# Patient Record
Sex: Female | Born: 1945 | Race: White | Hispanic: No | Marital: Married | Smoking: Never smoker
Health system: Southern US, Community
[De-identification: ages and names within clinical notes are randomized; demographics above are authoritative.]

## PROBLEM LIST (undated history)

## (undated) DIAGNOSIS — I10 Essential (primary) hypertension: Secondary | ICD-10-CM

## (undated) DIAGNOSIS — M1990 Unspecified osteoarthritis, unspecified site: Secondary | ICD-10-CM

## (undated) DIAGNOSIS — G8929 Other chronic pain: Secondary | ICD-10-CM

## (undated) DIAGNOSIS — E785 Hyperlipidemia, unspecified: Secondary | ICD-10-CM

## (undated) HISTORY — PX: EYE SURGERY: SHX253

## (undated) HISTORY — PX: OTHER SURGICAL HISTORY: SHX169

## (undated) HISTORY — PX: BUNIONECTOMY: SHX129

## (undated) HISTORY — PX: FINGER MASS EXCISION: SHX1638

## (undated) HISTORY — PX: CHOLESTEATOMA EXCISION: SHX1345

## (undated) HISTORY — PX: CHOLECYSTECTOMY: SHX55

## (undated) HISTORY — PX: DILATION AND CURETTAGE OF UTERUS: SHX78

---

## 1997-09-03 ENCOUNTER — Other Ambulatory Visit: Admission: RE | Admit: 1997-09-03 | Discharge: 1997-09-03 | Payer: Self-pay | Admitting: Obstetrics and Gynecology

## 1999-01-20 ENCOUNTER — Encounter: Payer: Self-pay | Admitting: Obstetrics and Gynecology

## 1999-01-20 ENCOUNTER — Encounter: Admission: RE | Admit: 1999-01-20 | Discharge: 1999-01-20 | Payer: Self-pay | Admitting: Obstetrics and Gynecology

## 1999-02-03 ENCOUNTER — Encounter: Payer: Self-pay | Admitting: Obstetrics and Gynecology

## 1999-02-03 ENCOUNTER — Encounter: Admission: RE | Admit: 1999-02-03 | Discharge: 1999-02-03 | Payer: Self-pay | Admitting: Obstetrics and Gynecology

## 1999-02-15 ENCOUNTER — Other Ambulatory Visit: Admission: RE | Admit: 1999-02-15 | Discharge: 1999-02-15 | Payer: Self-pay | Admitting: Obstetrics and Gynecology

## 1999-07-12 ENCOUNTER — Other Ambulatory Visit: Admission: RE | Admit: 1999-07-12 | Discharge: 1999-07-12 | Payer: Self-pay | Admitting: *Deleted

## 1999-07-12 ENCOUNTER — Encounter (INDEPENDENT_AMBULATORY_CARE_PROVIDER_SITE_OTHER): Payer: Self-pay | Admitting: Specialist

## 2001-02-28 ENCOUNTER — Encounter: Admission: RE | Admit: 2001-02-28 | Discharge: 2001-02-28 | Payer: Self-pay | Admitting: Obstetrics and Gynecology

## 2001-02-28 ENCOUNTER — Encounter: Payer: Self-pay | Admitting: Obstetrics and Gynecology

## 2001-05-28 ENCOUNTER — Other Ambulatory Visit: Admission: RE | Admit: 2001-05-28 | Discharge: 2001-05-28 | Payer: Self-pay | Admitting: Obstetrics and Gynecology

## 2001-10-09 ENCOUNTER — Other Ambulatory Visit: Admission: RE | Admit: 2001-10-09 | Discharge: 2001-10-09 | Payer: Self-pay | Admitting: Obstetrics and Gynecology

## 2002-06-03 ENCOUNTER — Other Ambulatory Visit: Admission: RE | Admit: 2002-06-03 | Discharge: 2002-06-03 | Payer: Self-pay | Admitting: Obstetrics and Gynecology

## 2002-06-03 ENCOUNTER — Encounter: Admission: RE | Admit: 2002-06-03 | Discharge: 2002-06-03 | Payer: Self-pay | Admitting: Obstetrics and Gynecology

## 2002-06-03 ENCOUNTER — Encounter: Payer: Self-pay | Admitting: Obstetrics and Gynecology

## 2003-12-16 ENCOUNTER — Encounter: Admission: RE | Admit: 2003-12-16 | Discharge: 2003-12-16 | Payer: Self-pay | Admitting: Obstetrics and Gynecology

## 2004-12-16 ENCOUNTER — Encounter: Admission: RE | Admit: 2004-12-16 | Discharge: 2004-12-16 | Payer: Self-pay | Admitting: Obstetrics and Gynecology

## 2006-01-16 ENCOUNTER — Encounter: Admission: RE | Admit: 2006-01-16 | Discharge: 2006-01-16 | Payer: Self-pay | Admitting: Obstetrics and Gynecology

## 2007-02-06 ENCOUNTER — Encounter: Admission: RE | Admit: 2007-02-06 | Discharge: 2007-02-06 | Payer: Self-pay | Admitting: Obstetrics and Gynecology

## 2008-02-27 ENCOUNTER — Encounter: Admission: RE | Admit: 2008-02-27 | Discharge: 2008-02-27 | Payer: Self-pay | Admitting: Obstetrics and Gynecology

## 2010-02-20 ENCOUNTER — Encounter: Payer: Self-pay | Admitting: Obstetrics and Gynecology

## 2011-07-01 ENCOUNTER — Other Ambulatory Visit: Payer: Self-pay | Admitting: Family Medicine

## 2011-07-01 DIAGNOSIS — Z1239 Encounter for other screening for malignant neoplasm of breast: Secondary | ICD-10-CM

## 2011-07-05 ENCOUNTER — Ambulatory Visit
Admission: RE | Admit: 2011-07-05 | Discharge: 2011-07-05 | Disposition: A | Discharge status: Home / Self Care | Payer: Medicare Other | Source: Ambulatory Visit | Attending: Family Medicine | Admitting: Family Medicine

## 2011-07-05 DIAGNOSIS — Z1239 Encounter for other screening for malignant neoplasm of breast: Secondary | ICD-10-CM

## 2014-04-10 ENCOUNTER — Other Ambulatory Visit: Payer: Self-pay

## 2014-04-10 DIAGNOSIS — Z1231 Encounter for screening mammogram for malignant neoplasm of breast: Secondary | ICD-10-CM

## 2014-04-18 ENCOUNTER — Ambulatory Visit
Admission: RE | Admit: 2014-04-18 | Discharge: 2014-04-18 | Disposition: A | Discharge status: Home / Self Care | Payer: Medicare Other | Source: Ambulatory Visit

## 2014-04-18 DIAGNOSIS — Z1231 Encounter for screening mammogram for malignant neoplasm of breast: Secondary | ICD-10-CM

## 2015-08-25 ENCOUNTER — Encounter: Payer: Self-pay | Admitting: Family Medicine

## 2016-03-09 ENCOUNTER — Other Ambulatory Visit: Payer: Self-pay | Admitting: Family Medicine

## 2016-03-09 DIAGNOSIS — Z1231 Encounter for screening mammogram for malignant neoplasm of breast: Secondary | ICD-10-CM

## 2016-04-18 ENCOUNTER — Encounter: Payer: Self-pay | Admitting: Radiology

## 2016-04-18 ENCOUNTER — Ambulatory Visit
Admission: RE | Admit: 2016-04-18 | Discharge: 2016-04-18 | Disposition: A | Discharge status: Home / Self Care | Payer: Medicare Other | Source: Ambulatory Visit | Attending: Family Medicine | Admitting: Family Medicine

## 2016-04-18 DIAGNOSIS — Z1231 Encounter for screening mammogram for malignant neoplasm of breast: Secondary | ICD-10-CM

## 2016-04-20 ENCOUNTER — Other Ambulatory Visit: Payer: Self-pay | Admitting: Family Medicine

## 2016-04-20 DIAGNOSIS — R928 Other abnormal and inconclusive findings on diagnostic imaging of breast: Secondary | ICD-10-CM

## 2016-04-25 ENCOUNTER — Ambulatory Visit
Admission: RE | Admit: 2016-04-25 | Discharge: 2016-04-25 | Disposition: A | Discharge status: Home / Self Care | Payer: Medicare Other | Source: Ambulatory Visit | Attending: Family Medicine | Admitting: Family Medicine

## 2016-04-25 DIAGNOSIS — R928 Other abnormal and inconclusive findings on diagnostic imaging of breast: Secondary | ICD-10-CM

## 2016-12-07 DIAGNOSIS — Z01818 Encounter for other preprocedural examination: Secondary | ICD-10-CM | POA: Diagnosis not present

## 2017-03-24 ENCOUNTER — Other Ambulatory Visit: Payer: Self-pay | Admitting: Family Medicine

## 2017-03-24 DIAGNOSIS — Z139 Encounter for screening, unspecified: Secondary | ICD-10-CM

## 2017-03-29 ENCOUNTER — Encounter: Payer: Self-pay | Admitting: Sports Medicine

## 2017-03-29 ENCOUNTER — Ambulatory Visit (INDEPENDENT_AMBULATORY_CARE_PROVIDER_SITE_OTHER): Payer: Medicare Other

## 2017-03-29 ENCOUNTER — Ambulatory Visit: Payer: Medicare Other | Admitting: Sports Medicine

## 2017-03-29 VITALS — BP 116/59 | HR 62 | Ht 66.0 in | Wt 190.0 lb

## 2017-03-29 DIAGNOSIS — M792 Neuralgia and neuritis, unspecified: Secondary | ICD-10-CM

## 2017-03-29 DIAGNOSIS — M79674 Pain in right toe(s): Secondary | ICD-10-CM

## 2017-03-29 DIAGNOSIS — I868 Varicose veins of other specified sites: Secondary | ICD-10-CM | POA: Diagnosis not present

## 2017-03-29 DIAGNOSIS — M2041 Other hammer toe(s) (acquired), right foot: Secondary | ICD-10-CM | POA: Diagnosis not present

## 2017-03-29 NOTE — Progress Notes (Signed)
  Subjective: Laura Bauer is a 72 y.o. female patient who presents to office for evaluation of Right foot pain. Patient complains of progressive pain especially over the last several months especially in the right second toe and the forefoot with significant burning over the toe with the second toe being most bothersome.  Reports that pain is now interferring with daily activities. Patient has tried changing shoes with no relief in symptoms. Patient denies any other pedal complaints.   Review of Systems  Musculoskeletal: Positive for joint pain.  All other systems reviewed and are negative.   There are no active problems to display for this patient.   Current Outpatient Medications on File Prior to Visit  Medication Sig Dispense Refill  . aspirin EC 81 MG tablet Take by mouth.    . ezetimibe-simvastatin (VYTORIN) 10-20 MG tablet TAKE ONE TABLET BY MOUTH EVERY DAY    . ramipril (ALTACE) 5 MG capsule TAKE 1 CAPSULE BY MOUTH ONCE (1) DAILY  3  . Vitamin D, Ergocalciferol, (DRISDOL) 50000 units CAPS capsule TAKE 1 CAPSULE BY MOUTH two TIMES PER MONTH     No current facility-administered medications on file prior to visit.     No Known Allergies  Objective:  General: Alert and oriented x3 in no acute distress  Dermatology: Small hyperkeratotic lesion overlying 2nd PIPJ dorsally on the right. No open lesions bilateral lower extremities, no webspace macerations, no ecchymosis bilateral, all nails x 10 are well manicured.  Vascular: Dorsalis Pedis and Posterior Tibial pedal pulses 1/4, Capillary Fill Time 3 seconds,(+) pedal hair growth bilateral, trace edema bilateral lower extremities, Temperature gradient within normal limits.  Varicosities bilateral.  Neurology: Gross sensation intact via light touch bilateral, subjective burning to right foot.  Musculoskeletal: Semi-flexible hammertoes 2-5 with Mild tenderness with palpation at PIPJ Right second toe with significant medial deviation  and dorsal dislocation, previous bunion and hammertoe surgery on left. No pain with calf compression bilateral.  Strength within normal limits in all groups bilateral.   Gait: Unassisted, Non-antalgic.  Xrays  Right Foot    Impression: Lesser hammertoe deformity mineralization of bone within normal limits mildly elongated metatarsals no other acute findings.       Assessment and Plan: Problem List Items Addressed This Visit    None    Visit Diagnoses    Hammer toe of right foot    -  Primary   Relevant Orders   DG Foot Complete Right   Toe pain, right       Relevant Orders   DG Foot Complete Right   Neuritis       Spider varicose vein       Relevant Medications   aspirin EC 81 MG tablet   ramipril (ALTACE) 5 MG capsule   ezetimibe-simvastatin (VYTORIN) 10-20 MG tablet      -Complete examination performed -Xrays reviewed -Discussed treatement options for toe pain likely secondary to dislocated hammertoe however due to significant burning concern for neuritis and 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  Asencion Islamitorya Celene Pippins, DPM

## 2017-03-30 ENCOUNTER — Telehealth: Payer: Self-pay | Admitting: *Deleted

## 2017-03-30 DIAGNOSIS — M792 Neuralgia and neuritis, unspecified: Secondary | ICD-10-CM

## 2017-03-30 DIAGNOSIS — I868 Varicose veins of other specified sites: Secondary | ICD-10-CM

## 2017-03-30 NOTE — Telephone Encounter (Signed)
-----   Message from Hankinsonitorya Stover, [place] sent at 03/29/2017  9:50 AM EST ----- Regarding: Venous reflux testing and NCV Please order reflux testing to check varicose veins And NCV to check since has burning pain on right Thanks Dr. Marylene LandStover

## 2017-03-30 NOTE — Telephone Encounter (Signed)
Faxed orders for Venous Reflux to [facility]. Fax[facility] for NCV with [facility].

## 2017-04-19 ENCOUNTER — Ambulatory Visit
Admission: RE | Admit: 2017-04-19 | Discharge: 2017-04-19 | Disposition: A | Discharge status: Home / Self Care | Payer: Medicare Other | Source: Ambulatory Visit | Attending: Family Medicine | Admitting: Family Medicine

## 2017-04-19 DIAGNOSIS — Z139 Encounter for screening, unspecified: Secondary | ICD-10-CM

## 2018-03-13 ENCOUNTER — Other Ambulatory Visit: Payer: Self-pay | Admitting: Family Medicine

## 2018-03-13 DIAGNOSIS — Z1231 Encounter for screening mammogram for malignant neoplasm of breast: Secondary | ICD-10-CM

## 2018-04-24 ENCOUNTER — Ambulatory Visit: Payer: Medicare Other

## 2018-07-12 ENCOUNTER — Telehealth: Payer: Self-pay | Admitting: *Deleted

## 2018-07-12 NOTE — Telephone Encounter (Signed)
Dr Cannon Kettle please advise somehow didn't send to you

## 2018-07-12 NOTE — Telephone Encounter (Signed)
Patient states that she never received a call with her vascular results.  Please advise

## 2018-07-26 ENCOUNTER — Ambulatory Visit
Admission: RE | Admit: 2018-07-26 | Discharge: 2018-07-26 | Disposition: A | Discharge status: Home / Self Care | Payer: Medicare Other | Source: Ambulatory Visit | Attending: Family Medicine | Admitting: Family Medicine

## 2018-07-26 ENCOUNTER — Other Ambulatory Visit: Payer: Self-pay

## 2018-07-26 DIAGNOSIS — Z1231 Encounter for screening mammogram for malignant neoplasm of breast: Secondary | ICD-10-CM

## 2019-02-21 DIAGNOSIS — Z9889 Other specified postprocedural states: Secondary | ICD-10-CM | POA: Diagnosis not present

## 2019-02-21 DIAGNOSIS — Z48810 Encounter for surgical aftercare following surgery on the sense organs: Secondary | ICD-10-CM | POA: Diagnosis not present

## 2019-02-21 DIAGNOSIS — H906 Mixed conductive and sensorineural hearing loss, bilateral: Secondary | ICD-10-CM | POA: Diagnosis not present

## 2019-03-18 DIAGNOSIS — L259 Unspecified contact dermatitis, unspecified cause: Secondary | ICD-10-CM | POA: Diagnosis not present

## 2019-03-18 DIAGNOSIS — Z1331 Encounter for screening for depression: Secondary | ICD-10-CM | POA: Diagnosis not present

## 2019-03-18 DIAGNOSIS — Z6834 Body mass index (BMI) 34.0-34.9, adult: Secondary | ICD-10-CM | POA: Diagnosis not present

## 2019-03-20 DIAGNOSIS — Z0000 Encounter for general adult medical examination without abnormal findings: Secondary | ICD-10-CM | POA: Diagnosis not present

## 2019-03-28 DIAGNOSIS — Z780 Asymptomatic menopausal state: Secondary | ICD-10-CM | POA: Diagnosis not present

## 2019-04-05 DIAGNOSIS — K648 Other hemorrhoids: Secondary | ICD-10-CM | POA: Diagnosis not present

## 2019-04-05 DIAGNOSIS — K625 Hemorrhage of anus and rectum: Secondary | ICD-10-CM | POA: Diagnosis not present

## 2019-04-05 DIAGNOSIS — E669 Obesity, unspecified: Secondary | ICD-10-CM | POA: Diagnosis not present

## 2019-04-05 DIAGNOSIS — Z6833 Body mass index (BMI) 33.0-33.9, adult: Secondary | ICD-10-CM | POA: Diagnosis not present

## 2019-07-09 ENCOUNTER — Other Ambulatory Visit: Payer: Self-pay | Admitting: Family Medicine

## 2019-07-09 DIAGNOSIS — Z1231 Encounter for screening mammogram for malignant neoplasm of breast: Secondary | ICD-10-CM

## 2019-07-29 ENCOUNTER — Ambulatory Visit
Admission: RE | Admit: 2019-07-29 | Discharge: 2019-07-29 | Disposition: A | Discharge status: Home / Self Care | Payer: Medicare PPO | Source: Ambulatory Visit | Attending: Family Medicine | Admitting: Family Medicine

## 2019-07-29 ENCOUNTER — Other Ambulatory Visit: Payer: Self-pay

## 2019-07-29 DIAGNOSIS — Z1231 Encounter for screening mammogram for malignant neoplasm of breast: Secondary | ICD-10-CM | POA: Diagnosis not present

## 2019-07-31 ENCOUNTER — Other Ambulatory Visit: Payer: Self-pay | Admitting: Family Medicine

## 2019-07-31 DIAGNOSIS — R928 Other abnormal and inconclusive findings on diagnostic imaging of breast: Secondary | ICD-10-CM

## 2019-08-06 ENCOUNTER — Other Ambulatory Visit: Payer: Self-pay

## 2019-08-06 ENCOUNTER — Ambulatory Visit
Admission: RE | Admit: 2019-08-06 | Discharge: 2019-08-06 | Disposition: A | Discharge status: Home / Self Care | Payer: Medicare PPO | Source: Ambulatory Visit | Attending: Family Medicine | Admitting: Family Medicine

## 2019-08-06 DIAGNOSIS — N6002 Solitary cyst of left breast: Secondary | ICD-10-CM | POA: Diagnosis not present

## 2019-08-06 DIAGNOSIS — R922 Inconclusive mammogram: Secondary | ICD-10-CM | POA: Diagnosis not present

## 2019-08-06 DIAGNOSIS — R928 Other abnormal and inconclusive findings on diagnostic imaging of breast: Secondary | ICD-10-CM

## 2019-08-22 DIAGNOSIS — Z9889 Other specified postprocedural states: Secondary | ICD-10-CM | POA: Diagnosis not present

## 2019-08-22 DIAGNOSIS — H906 Mixed conductive and sensorineural hearing loss, bilateral: Secondary | ICD-10-CM | POA: Diagnosis not present

## 2019-08-22 DIAGNOSIS — H7292 Unspecified perforation of tympanic membrane, left ear: Secondary | ICD-10-CM | POA: Diagnosis not present

## 2019-08-22 DIAGNOSIS — H90A31 Mixed conductive and sensorineural hearing loss, unilateral, right ear with restricted hearing on the contralateral side: Secondary | ICD-10-CM | POA: Diagnosis not present

## 2019-08-22 DIAGNOSIS — H90A22 Sensorineural hearing loss, unilateral, left ear, with restricted hearing on the contralateral side: Secondary | ICD-10-CM | POA: Diagnosis not present

## 2019-08-22 DIAGNOSIS — Z48810 Encounter for surgical aftercare following surgery on the sense organs: Secondary | ICD-10-CM | POA: Diagnosis not present

## 2019-08-22 DIAGNOSIS — H918X9 Other specified hearing loss, unspecified ear: Secondary | ICD-10-CM | POA: Diagnosis not present

## 2019-08-26 DIAGNOSIS — E7800 Pure hypercholesterolemia, unspecified: Secondary | ICD-10-CM | POA: Diagnosis not present

## 2019-08-26 DIAGNOSIS — Z79899 Other long term (current) drug therapy: Secondary | ICD-10-CM | POA: Diagnosis not present

## 2019-09-02 DIAGNOSIS — M810 Age-related osteoporosis without current pathological fracture: Secondary | ICD-10-CM | POA: Diagnosis not present

## 2019-09-02 DIAGNOSIS — E7800 Pure hypercholesterolemia, unspecified: Secondary | ICD-10-CM | POA: Diagnosis not present

## 2019-09-02 DIAGNOSIS — Z6833 Body mass index (BMI) 33.0-33.9, adult: Secondary | ICD-10-CM | POA: Diagnosis not present

## 2019-09-02 DIAGNOSIS — Z9181 History of falling: Secondary | ICD-10-CM | POA: Diagnosis not present

## 2019-09-02 DIAGNOSIS — I10 Essential (primary) hypertension: Secondary | ICD-10-CM | POA: Diagnosis not present

## 2019-09-03 DIAGNOSIS — H26491 Other secondary cataract, right eye: Secondary | ICD-10-CM | POA: Diagnosis not present

## 2019-09-30 DIAGNOSIS — J329 Chronic sinusitis, unspecified: Secondary | ICD-10-CM | POA: Diagnosis not present

## 2019-10-08 DIAGNOSIS — H26492 Other secondary cataract, left eye: Secondary | ICD-10-CM | POA: Diagnosis not present

## 2019-11-14 DIAGNOSIS — Z23 Encounter for immunization: Secondary | ICD-10-CM | POA: Diagnosis not present

## 2020-02-13 DIAGNOSIS — Z6833 Body mass index (BMI) 33.0-33.9, adult: Secondary | ICD-10-CM | POA: Diagnosis not present

## 2020-02-13 DIAGNOSIS — H9202 Otalgia, left ear: Secondary | ICD-10-CM | POA: Diagnosis not present

## 2020-02-19 DIAGNOSIS — M542 Cervicalgia: Secondary | ICD-10-CM | POA: Diagnosis not present

## 2020-02-19 DIAGNOSIS — M25512 Pain in left shoulder: Secondary | ICD-10-CM | POA: Diagnosis not present

## 2020-02-20 DIAGNOSIS — H90A31 Mixed conductive and sensorineural hearing loss, unilateral, right ear with restricted hearing on the contralateral side: Secondary | ICD-10-CM | POA: Diagnosis not present

## 2020-02-20 DIAGNOSIS — H663X2 Other chronic suppurative otitis media, left ear: Secondary | ICD-10-CM | POA: Diagnosis not present

## 2020-02-20 DIAGNOSIS — Z9889 Other specified postprocedural states: Secondary | ICD-10-CM | POA: Diagnosis not present

## 2020-02-20 DIAGNOSIS — Z974 Presence of external hearing-aid: Secondary | ICD-10-CM | POA: Diagnosis not present

## 2020-02-20 DIAGNOSIS — H906 Mixed conductive and sensorineural hearing loss, bilateral: Secondary | ICD-10-CM | POA: Diagnosis not present

## 2020-02-20 DIAGNOSIS — H6983 Other specified disorders of Eustachian tube, bilateral: Secondary | ICD-10-CM | POA: Diagnosis not present

## 2020-02-20 DIAGNOSIS — H90A22 Sensorineural hearing loss, unilateral, left ear, with restricted hearing on the contralateral side: Secondary | ICD-10-CM | POA: Diagnosis not present

## 2020-02-20 DIAGNOSIS — Z9622 Myringotomy tube(s) status: Secondary | ICD-10-CM | POA: Diagnosis not present

## 2020-02-27 DIAGNOSIS — Z8669 Personal history of other diseases of the nervous system and sense organs: Secondary | ICD-10-CM | POA: Diagnosis not present

## 2020-02-27 DIAGNOSIS — H90A31 Mixed conductive and sensorineural hearing loss, unilateral, right ear with restricted hearing on the contralateral side: Secondary | ICD-10-CM | POA: Diagnosis not present

## 2020-02-27 DIAGNOSIS — Z9622 Myringotomy tube(s) status: Secondary | ICD-10-CM | POA: Diagnosis not present

## 2020-02-27 DIAGNOSIS — H6612 Chronic tubotympanic suppurative otitis media, left ear: Secondary | ICD-10-CM | POA: Diagnosis not present

## 2020-02-27 DIAGNOSIS — H6983 Other specified disorders of Eustachian tube, bilateral: Secondary | ICD-10-CM | POA: Diagnosis not present

## 2020-02-27 DIAGNOSIS — Z9889 Other specified postprocedural states: Secondary | ICD-10-CM | POA: Diagnosis not present

## 2020-02-27 DIAGNOSIS — Z974 Presence of external hearing-aid: Secondary | ICD-10-CM | POA: Diagnosis not present

## 2020-02-27 DIAGNOSIS — H906 Mixed conductive and sensorineural hearing loss, bilateral: Secondary | ICD-10-CM | POA: Diagnosis not present

## 2020-03-02 DIAGNOSIS — I10 Essential (primary) hypertension: Secondary | ICD-10-CM | POA: Diagnosis not present

## 2020-03-02 DIAGNOSIS — M8580 Other specified disorders of bone density and structure, unspecified site: Secondary | ICD-10-CM | POA: Diagnosis not present

## 2020-03-09 DIAGNOSIS — Z6833 Body mass index (BMI) 33.0-33.9, adult: Secondary | ICD-10-CM | POA: Diagnosis not present

## 2020-03-09 DIAGNOSIS — E7800 Pure hypercholesterolemia, unspecified: Secondary | ICD-10-CM | POA: Diagnosis not present

## 2020-03-09 DIAGNOSIS — I10 Essential (primary) hypertension: Secondary | ICD-10-CM | POA: Diagnosis not present

## 2020-03-09 DIAGNOSIS — M8580 Other specified disorders of bone density and structure, unspecified site: Secondary | ICD-10-CM | POA: Diagnosis not present

## 2020-03-12 DIAGNOSIS — M25512 Pain in left shoulder: Secondary | ICD-10-CM | POA: Diagnosis not present

## 2020-03-19 DIAGNOSIS — M25512 Pain in left shoulder: Secondary | ICD-10-CM | POA: Diagnosis not present

## 2020-03-23 DIAGNOSIS — M6281 Muscle weakness (generalized): Secondary | ICD-10-CM | POA: Diagnosis not present

## 2020-03-26 DIAGNOSIS — M6281 Muscle weakness (generalized): Secondary | ICD-10-CM | POA: Diagnosis not present

## 2020-03-31 DIAGNOSIS — M6281 Muscle weakness (generalized): Secondary | ICD-10-CM | POA: Diagnosis not present

## 2020-04-02 DIAGNOSIS — M6281 Muscle weakness (generalized): Secondary | ICD-10-CM | POA: Diagnosis not present

## 2020-04-06 DIAGNOSIS — M6281 Muscle weakness (generalized): Secondary | ICD-10-CM | POA: Diagnosis not present

## 2020-04-09 DIAGNOSIS — M6281 Muscle weakness (generalized): Secondary | ICD-10-CM | POA: Diagnosis not present

## 2020-04-13 DIAGNOSIS — M6281 Muscle weakness (generalized): Secondary | ICD-10-CM | POA: Diagnosis not present

## 2020-04-16 DIAGNOSIS — M6281 Muscle weakness (generalized): Secondary | ICD-10-CM | POA: Diagnosis not present

## 2020-04-20 DIAGNOSIS — M6281 Muscle weakness (generalized): Secondary | ICD-10-CM | POA: Diagnosis not present

## 2020-04-23 DIAGNOSIS — M6281 Muscle weakness (generalized): Secondary | ICD-10-CM | POA: Diagnosis not present

## 2020-04-27 DIAGNOSIS — M6281 Muscle weakness (generalized): Secondary | ICD-10-CM | POA: Diagnosis not present

## 2020-04-30 DIAGNOSIS — M6281 Muscle weakness (generalized): Secondary | ICD-10-CM | POA: Diagnosis not present

## 2020-05-11 DIAGNOSIS — J302 Other seasonal allergic rhinitis: Secondary | ICD-10-CM | POA: Diagnosis not present

## 2020-05-20 DIAGNOSIS — K648 Other hemorrhoids: Secondary | ICD-10-CM | POA: Diagnosis not present

## 2020-05-20 DIAGNOSIS — K5900 Constipation, unspecified: Secondary | ICD-10-CM | POA: Diagnosis not present

## 2020-06-01 DIAGNOSIS — I10 Essential (primary) hypertension: Secondary | ICD-10-CM | POA: Diagnosis not present

## 2020-06-01 DIAGNOSIS — Z6833 Body mass index (BMI) 33.0-33.9, adult: Secondary | ICD-10-CM | POA: Diagnosis not present

## 2020-06-01 DIAGNOSIS — Z1331 Encounter for screening for depression: Secondary | ICD-10-CM | POA: Diagnosis not present

## 2020-06-18 DIAGNOSIS — Z7982 Long term (current) use of aspirin: Secondary | ICD-10-CM | POA: Diagnosis not present

## 2020-06-18 DIAGNOSIS — I10 Essential (primary) hypertension: Secondary | ICD-10-CM | POA: Diagnosis not present

## 2020-06-18 DIAGNOSIS — Z86010 Personal history of colonic polyps: Secondary | ICD-10-CM | POA: Diagnosis not present

## 2020-06-18 DIAGNOSIS — Z79899 Other long term (current) drug therapy: Secondary | ICD-10-CM | POA: Diagnosis not present

## 2020-06-18 DIAGNOSIS — K648 Other hemorrhoids: Secondary | ICD-10-CM | POA: Diagnosis not present

## 2020-06-18 DIAGNOSIS — Z09 Encounter for follow-up examination after completed treatment for conditions other than malignant neoplasm: Secondary | ICD-10-CM | POA: Diagnosis not present

## 2020-07-21 ENCOUNTER — Other Ambulatory Visit: Payer: Self-pay | Admitting: Family Medicine

## 2020-07-21 DIAGNOSIS — Z1231 Encounter for screening mammogram for malignant neoplasm of breast: Secondary | ICD-10-CM

## 2020-08-17 DIAGNOSIS — E7800 Pure hypercholesterolemia, unspecified: Secondary | ICD-10-CM | POA: Diagnosis not present

## 2020-08-21 DIAGNOSIS — I10 Essential (primary) hypertension: Secondary | ICD-10-CM | POA: Diagnosis not present

## 2020-08-21 DIAGNOSIS — Z6833 Body mass index (BMI) 33.0-33.9, adult: Secondary | ICD-10-CM | POA: Diagnosis not present

## 2020-08-21 DIAGNOSIS — E7800 Pure hypercholesterolemia, unspecified: Secondary | ICD-10-CM | POA: Diagnosis not present

## 2020-08-21 DIAGNOSIS — Z0000 Encounter for general adult medical examination without abnormal findings: Secondary | ICD-10-CM | POA: Diagnosis not present

## 2020-08-21 DIAGNOSIS — Z1331 Encounter for screening for depression: Secondary | ICD-10-CM | POA: Diagnosis not present

## 2020-08-27 DIAGNOSIS — H6042 Cholesteatoma of left external ear: Secondary | ICD-10-CM | POA: Diagnosis not present

## 2020-08-27 DIAGNOSIS — H906 Mixed conductive and sensorineural hearing loss, bilateral: Secondary | ICD-10-CM | POA: Diagnosis not present

## 2020-08-27 DIAGNOSIS — H7202 Central perforation of tympanic membrane, left ear: Secondary | ICD-10-CM | POA: Diagnosis not present

## 2020-08-27 DIAGNOSIS — H6983 Other specified disorders of Eustachian tube, bilateral: Secondary | ICD-10-CM | POA: Diagnosis not present

## 2020-08-27 DIAGNOSIS — Z9889 Other specified postprocedural states: Secondary | ICD-10-CM | POA: Diagnosis not present

## 2020-08-27 DIAGNOSIS — H900 Conductive hearing loss, bilateral: Secondary | ICD-10-CM | POA: Diagnosis not present

## 2020-09-08 DIAGNOSIS — H43813 Vitreous degeneration, bilateral: Secondary | ICD-10-CM | POA: Diagnosis not present

## 2020-09-08 DIAGNOSIS — Z961 Presence of intraocular lens: Secondary | ICD-10-CM | POA: Diagnosis not present

## 2020-09-08 DIAGNOSIS — H5203 Hypermetropia, bilateral: Secondary | ICD-10-CM | POA: Diagnosis not present

## 2020-09-11 ENCOUNTER — Other Ambulatory Visit: Payer: Self-pay

## 2020-09-11 ENCOUNTER — Ambulatory Visit
Admission: RE | Admit: 2020-09-11 | Discharge: 2020-09-11 | Disposition: A | Discharge status: Home / Self Care | Payer: Medicare PPO | Source: Ambulatory Visit | Attending: Family Medicine | Admitting: Family Medicine

## 2020-09-11 DIAGNOSIS — Z1231 Encounter for screening mammogram for malignant neoplasm of breast: Secondary | ICD-10-CM | POA: Diagnosis not present

## 2020-10-01 DIAGNOSIS — E785 Hyperlipidemia, unspecified: Secondary | ICD-10-CM | POA: Diagnosis not present

## 2020-10-01 DIAGNOSIS — Z803 Family history of malignant neoplasm of breast: Secondary | ICD-10-CM | POA: Diagnosis not present

## 2020-10-01 DIAGNOSIS — Z823 Family history of stroke: Secondary | ICD-10-CM | POA: Diagnosis not present

## 2020-10-01 DIAGNOSIS — Z6832 Body mass index (BMI) 32.0-32.9, adult: Secondary | ICD-10-CM | POA: Diagnosis not present

## 2020-10-01 DIAGNOSIS — E669 Obesity, unspecified: Secondary | ICD-10-CM | POA: Diagnosis not present

## 2020-10-01 DIAGNOSIS — I10 Essential (primary) hypertension: Secondary | ICD-10-CM | POA: Diagnosis not present

## 2020-10-01 DIAGNOSIS — I739 Peripheral vascular disease, unspecified: Secondary | ICD-10-CM | POA: Diagnosis not present

## 2020-10-01 DIAGNOSIS — Z7982 Long term (current) use of aspirin: Secondary | ICD-10-CM | POA: Diagnosis not present

## 2020-10-01 DIAGNOSIS — E559 Vitamin D deficiency, unspecified: Secondary | ICD-10-CM | POA: Diagnosis not present

## 2020-10-27 DIAGNOSIS — Z23 Encounter for immunization: Secondary | ICD-10-CM | POA: Diagnosis not present

## 2020-11-17 DIAGNOSIS — E7800 Pure hypercholesterolemia, unspecified: Secondary | ICD-10-CM | POA: Diagnosis not present

## 2020-11-24 DIAGNOSIS — Z6833 Body mass index (BMI) 33.0-33.9, adult: Secondary | ICD-10-CM | POA: Diagnosis not present

## 2020-11-24 DIAGNOSIS — E7800 Pure hypercholesterolemia, unspecified: Secondary | ICD-10-CM | POA: Diagnosis not present

## 2020-11-24 DIAGNOSIS — Z9181 History of falling: Secondary | ICD-10-CM | POA: Diagnosis not present

## 2020-11-27 DIAGNOSIS — Z043 Encounter for examination and observation following other accident: Secondary | ICD-10-CM | POA: Diagnosis not present

## 2020-11-27 DIAGNOSIS — M19011 Primary osteoarthritis, right shoulder: Secondary | ICD-10-CM | POA: Diagnosis not present

## 2020-11-27 DIAGNOSIS — M25511 Pain in right shoulder: Secondary | ICD-10-CM | POA: Diagnosis not present

## 2020-12-01 DIAGNOSIS — M25511 Pain in right shoulder: Secondary | ICD-10-CM | POA: Diagnosis not present

## 2020-12-01 DIAGNOSIS — Z6833 Body mass index (BMI) 33.0-33.9, adult: Secondary | ICD-10-CM | POA: Diagnosis not present

## 2020-12-14 DIAGNOSIS — M25511 Pain in right shoulder: Secondary | ICD-10-CM | POA: Diagnosis not present

## 2020-12-14 DIAGNOSIS — M542 Cervicalgia: Secondary | ICD-10-CM | POA: Diagnosis not present

## 2020-12-28 DIAGNOSIS — M25511 Pain in right shoulder: Secondary | ICD-10-CM | POA: Diagnosis not present

## 2020-12-29 DIAGNOSIS — M25511 Pain in right shoulder: Secondary | ICD-10-CM | POA: Diagnosis not present

## 2020-12-29 DIAGNOSIS — M6281 Muscle weakness (generalized): Secondary | ICD-10-CM | POA: Diagnosis not present

## 2021-01-01 DIAGNOSIS — M25511 Pain in right shoulder: Secondary | ICD-10-CM | POA: Diagnosis not present

## 2021-01-01 DIAGNOSIS — M6281 Muscle weakness (generalized): Secondary | ICD-10-CM | POA: Diagnosis not present

## 2021-01-05 DIAGNOSIS — M25511 Pain in right shoulder: Secondary | ICD-10-CM | POA: Diagnosis not present

## 2021-01-05 DIAGNOSIS — M6281 Muscle weakness (generalized): Secondary | ICD-10-CM | POA: Diagnosis not present

## 2021-01-07 DIAGNOSIS — M25511 Pain in right shoulder: Secondary | ICD-10-CM | POA: Diagnosis not present

## 2021-01-07 DIAGNOSIS — M6281 Muscle weakness (generalized): Secondary | ICD-10-CM | POA: Diagnosis not present

## 2021-01-11 DIAGNOSIS — M542 Cervicalgia: Secondary | ICD-10-CM | POA: Diagnosis not present

## 2021-01-11 DIAGNOSIS — M25511 Pain in right shoulder: Secondary | ICD-10-CM | POA: Diagnosis not present

## 2021-01-12 DIAGNOSIS — M6281 Muscle weakness (generalized): Secondary | ICD-10-CM | POA: Diagnosis not present

## 2021-01-12 DIAGNOSIS — M25511 Pain in right shoulder: Secondary | ICD-10-CM | POA: Diagnosis not present

## 2021-01-14 DIAGNOSIS — M25511 Pain in right shoulder: Secondary | ICD-10-CM | POA: Diagnosis not present

## 2021-01-14 DIAGNOSIS — M6281 Muscle weakness (generalized): Secondary | ICD-10-CM | POA: Diagnosis not present

## 2021-01-18 DIAGNOSIS — M6281 Muscle weakness (generalized): Secondary | ICD-10-CM | POA: Diagnosis not present

## 2021-01-18 DIAGNOSIS — M25511 Pain in right shoulder: Secondary | ICD-10-CM | POA: Diagnosis not present

## 2021-01-21 DIAGNOSIS — M6281 Muscle weakness (generalized): Secondary | ICD-10-CM | POA: Diagnosis not present

## 2021-01-21 DIAGNOSIS — M25511 Pain in right shoulder: Secondary | ICD-10-CM | POA: Diagnosis not present

## 2021-02-02 DIAGNOSIS — M6281 Muscle weakness (generalized): Secondary | ICD-10-CM | POA: Diagnosis not present

## 2021-02-02 DIAGNOSIS — M25511 Pain in right shoulder: Secondary | ICD-10-CM | POA: Diagnosis not present

## 2021-02-04 DIAGNOSIS — M6281 Muscle weakness (generalized): Secondary | ICD-10-CM | POA: Diagnosis not present

## 2021-02-04 DIAGNOSIS — M25511 Pain in right shoulder: Secondary | ICD-10-CM | POA: Diagnosis not present

## 2021-02-08 DIAGNOSIS — M2578 Osteophyte, vertebrae: Secondary | ICD-10-CM | POA: Diagnosis not present

## 2021-02-08 DIAGNOSIS — M542 Cervicalgia: Secondary | ICD-10-CM | POA: Diagnosis not present

## 2021-02-10 DIAGNOSIS — M25511 Pain in right shoulder: Secondary | ICD-10-CM | POA: Diagnosis not present

## 2021-02-11 DIAGNOSIS — M25511 Pain in right shoulder: Secondary | ICD-10-CM | POA: Diagnosis not present

## 2021-02-11 DIAGNOSIS — M6281 Muscle weakness (generalized): Secondary | ICD-10-CM | POA: Diagnosis not present

## 2021-02-15 DIAGNOSIS — M542 Cervicalgia: Secondary | ICD-10-CM | POA: Diagnosis not present

## 2021-02-15 DIAGNOSIS — M25511 Pain in right shoulder: Secondary | ICD-10-CM | POA: Diagnosis not present

## 2021-02-15 DIAGNOSIS — M79621 Pain in right upper arm: Secondary | ICD-10-CM | POA: Diagnosis not present

## 2021-02-16 DIAGNOSIS — M25511 Pain in right shoulder: Secondary | ICD-10-CM | POA: Diagnosis not present

## 2021-02-20 DIAGNOSIS — M542 Cervicalgia: Secondary | ICD-10-CM | POA: Diagnosis not present

## 2021-02-20 DIAGNOSIS — M5412 Radiculopathy, cervical region: Secondary | ICD-10-CM | POA: Diagnosis not present

## 2021-02-25 DIAGNOSIS — Z9889 Other specified postprocedural states: Secondary | ICD-10-CM | POA: Diagnosis not present

## 2021-02-25 DIAGNOSIS — H6042 Cholesteatoma of left external ear: Secondary | ICD-10-CM | POA: Diagnosis not present

## 2021-02-25 DIAGNOSIS — H906 Mixed conductive and sensorineural hearing loss, bilateral: Secondary | ICD-10-CM | POA: Diagnosis not present

## 2021-02-25 DIAGNOSIS — H6983 Other specified disorders of Eustachian tube, bilateral: Secondary | ICD-10-CM | POA: Diagnosis not present

## 2021-02-25 DIAGNOSIS — H903 Sensorineural hearing loss, bilateral: Secondary | ICD-10-CM | POA: Diagnosis not present

## 2021-02-25 DIAGNOSIS — H7202 Central perforation of tympanic membrane, left ear: Secondary | ICD-10-CM | POA: Diagnosis not present

## 2021-03-02 DIAGNOSIS — M5412 Radiculopathy, cervical region: Secondary | ICD-10-CM | POA: Diagnosis not present

## 2021-03-18 DIAGNOSIS — E7800 Pure hypercholesterolemia, unspecified: Secondary | ICD-10-CM | POA: Diagnosis not present

## 2021-03-18 DIAGNOSIS — Z79899 Other long term (current) drug therapy: Secondary | ICD-10-CM | POA: Diagnosis not present

## 2021-03-23 DIAGNOSIS — Z6834 Body mass index (BMI) 34.0-34.9, adult: Secondary | ICD-10-CM | POA: Diagnosis not present

## 2021-03-23 DIAGNOSIS — I10 Essential (primary) hypertension: Secondary | ICD-10-CM | POA: Diagnosis not present

## 2021-03-23 DIAGNOSIS — E7800 Pure hypercholesterolemia, unspecified: Secondary | ICD-10-CM | POA: Diagnosis not present

## 2021-03-25 DIAGNOSIS — M5412 Radiculopathy, cervical region: Secondary | ICD-10-CM | POA: Diagnosis not present

## 2021-03-25 DIAGNOSIS — M25511 Pain in right shoulder: Secondary | ICD-10-CM | POA: Diagnosis not present

## 2021-04-28 DIAGNOSIS — M5412 Radiculopathy, cervical region: Secondary | ICD-10-CM | POA: Diagnosis not present

## 2021-04-29 DIAGNOSIS — S29012A Strain of muscle and tendon of back wall of thorax, initial encounter: Secondary | ICD-10-CM | POA: Diagnosis not present

## 2021-04-29 DIAGNOSIS — Z6833 Body mass index (BMI) 33.0-33.9, adult: Secondary | ICD-10-CM | POA: Diagnosis not present

## 2021-05-16 DIAGNOSIS — M5432 Sciatica, left side: Secondary | ICD-10-CM | POA: Diagnosis not present

## 2021-05-23 DIAGNOSIS — M5432 Sciatica, left side: Secondary | ICD-10-CM | POA: Diagnosis not present

## 2021-05-24 DIAGNOSIS — M79621 Pain in right upper arm: Secondary | ICD-10-CM | POA: Diagnosis not present

## 2021-05-24 DIAGNOSIS — M5412 Radiculopathy, cervical region: Secondary | ICD-10-CM | POA: Diagnosis not present

## 2021-05-24 DIAGNOSIS — M25511 Pain in right shoulder: Secondary | ICD-10-CM | POA: Diagnosis not present

## 2021-05-31 DIAGNOSIS — M25511 Pain in right shoulder: Secondary | ICD-10-CM | POA: Diagnosis not present

## 2021-06-07 DIAGNOSIS — M75121 Complete rotator cuff tear or rupture of right shoulder, not specified as traumatic: Secondary | ICD-10-CM | POA: Diagnosis not present

## 2021-06-07 DIAGNOSIS — M25511 Pain in right shoulder: Secondary | ICD-10-CM | POA: Diagnosis not present

## 2021-06-11 DIAGNOSIS — M75121 Complete rotator cuff tear or rupture of right shoulder, not specified as traumatic: Secondary | ICD-10-CM | POA: Diagnosis not present

## 2021-06-19 DIAGNOSIS — R42 Dizziness and giddiness: Secondary | ICD-10-CM | POA: Diagnosis not present

## 2021-06-19 DIAGNOSIS — R55 Syncope and collapse: Secondary | ICD-10-CM | POA: Diagnosis not present

## 2021-07-02 DIAGNOSIS — M25511 Pain in right shoulder: Secondary | ICD-10-CM | POA: Diagnosis not present

## 2021-07-04 DIAGNOSIS — G8929 Other chronic pain: Secondary | ICD-10-CM | POA: Diagnosis not present

## 2021-07-04 DIAGNOSIS — M5441 Lumbago with sciatica, right side: Secondary | ICD-10-CM | POA: Diagnosis not present

## 2021-07-27 ENCOUNTER — Other Ambulatory Visit: Payer: Self-pay

## 2021-07-27 ENCOUNTER — Encounter (HOSPITAL_BASED_OUTPATIENT_CLINIC_OR_DEPARTMENT_OTHER): Payer: Self-pay | Admitting: Orthopaedic Surgery

## 2021-07-29 ENCOUNTER — Other Ambulatory Visit: Payer: Self-pay | Admitting: Family Medicine

## 2021-07-29 DIAGNOSIS — Z1231 Encounter for screening mammogram for malignant neoplasm of breast: Secondary | ICD-10-CM

## 2021-08-04 DIAGNOSIS — Z96611 Presence of right artificial shoulder joint: Secondary | ICD-10-CM | POA: Diagnosis not present

## 2021-08-04 DIAGNOSIS — M75121 Complete rotator cuff tear or rupture of right shoulder, not specified as traumatic: Secondary | ICD-10-CM | POA: Diagnosis not present

## 2021-08-09 ENCOUNTER — Encounter (HOSPITAL_BASED_OUTPATIENT_CLINIC_OR_DEPARTMENT_OTHER)
Admission: RE | Admit: 2021-08-09 | Discharge: 2021-08-09 | Disposition: A | Discharge status: Home / Self Care | Payer: Medicare PPO | Source: Ambulatory Visit | Attending: Orthopaedic Surgery | Admitting: Orthopaedic Surgery

## 2021-08-09 DIAGNOSIS — M19011 Primary osteoarthritis, right shoulder: Secondary | ICD-10-CM | POA: Diagnosis not present

## 2021-08-09 DIAGNOSIS — Z01810 Encounter for preprocedural cardiovascular examination: Secondary | ICD-10-CM | POA: Insufficient documentation

## 2021-08-09 DIAGNOSIS — M75121 Complete rotator cuff tear or rupture of right shoulder, not specified as traumatic: Secondary | ICD-10-CM | POA: Diagnosis not present

## 2021-08-09 DIAGNOSIS — E785 Hyperlipidemia, unspecified: Secondary | ICD-10-CM | POA: Diagnosis not present

## 2021-08-09 DIAGNOSIS — I10 Essential (primary) hypertension: Secondary | ICD-10-CM | POA: Insufficient documentation

## 2021-08-09 LAB — SURGICAL PCR SCREEN
MRSA, PCR: NEGATIVE
Staphylococcus aureus: NEGATIVE

## 2021-08-09 NOTE — Progress Notes (Signed)
Surgical soap given with instructions, pt verbalized understanding. Enhanced Recovery after Surgery  ?Enhanced Recovery after Surgery is a protocol used to improve the stress on your body and your recovery after surgery. ? ?Patient Instructions ? ?The night before surgery:  ?No food after midnight. ONLY clear liquids after midnight ? ?The day of surgery (if you do NOT have diabetes):  ?Drink ONE (1) Pre-Surgery Clear Ensure as directed.   ?This drink was given to you during your hospital  ?pre-op appointment visit. ?The pre-op nurse will instruct you on the time to drink the  ?Pre-Surgery Ensure depending on your surgery time. ?Finish the drink at the designated time by the pre-op nurse.  ?Nothing else to drink after completing the  ?Pre-Surgery Clear Ensure. ? ?The day of surgery (if you have diabetes): ?Drink ONE (1) Gatorade 2 (G2) as directed. ?This drink was given to you during your hospital  ?pre-op appointment visit.  ?The pre-op nurse will instruct you on the time to drink the  ? Gatorade 2 (G2) depending on your surgery time. ?Color of the Gatorade may vary. Red is not allowed. ?Nothing else to drink after completing the  ?Gatorade 2 (G2). ? ?       If office.you have questions, please contact your surgeon?s office  ?Benzoyl peroxide gel given with written instructions, pt verbalized understanding.  ?

## 2021-08-09 NOTE — Care Plan (Signed)
Ortho Bundle Case Management Note  Patient Details  Name: Laura Bauer MRN: [id number] Date of Birth: 10-04-1945     Spoke with patient prior to surgery. She will discharge to home with family to assist. Should get CTU from [facility]. No other DME. OPPT set up with [facility] [place]. Patient and MD in agreement with plan. Choice offered                 DME Arranged:    DME Agency:     HH Arranged:    HH Agency:     Additional Comments: Please contact me with any questions of if this plan should need to change.  Shauna Hugh,  RN,BSN,MHA,CCM  [facility]  [phone] 08/09/2021, 10:09 AM

## 2021-08-11 NOTE — Anesthesia Preprocedure Evaluation (Signed)
Anesthesia Evaluation  Patient identified by MRN, date of birth, ID band Patient awake    Reviewed: Allergy & Precautions, NPO status , Patient's Chart, lab work & pertinent test results  Airway Mallampati: II  TM Distance: >3 FB Neck ROM: Full    Dental no notable dental hx. (+) Teeth Intact, Dental Advisory Given   Pulmonary neg pulmonary ROS,    Pulmonary exam normal breath sounds clear to auscultation       Cardiovascular hypertension, Pt. on medications Normal cardiovascular exam Rhythm:Regular Rate:Normal     Neuro/Psych negative neurological ROS  negative psych ROS   GI/Hepatic negative GI ROS, Neg liver ROS,   Endo/Other  BMI 34  Renal/GU negative Renal ROS  negative genitourinary   Musculoskeletal  (+) Arthritis , Osteoarthritis,    Abdominal   Peds  Hematology negative hematology ROS (+)   Anesthesia Other Findings   Reproductive/Obstetrics negative OB ROS                            Anesthesia Physical Anesthesia Plan  ASA: 2  Anesthesia Plan: General and Regional   Post-op Pain Management: Regional block*   Induction: Intravenous  PONV Risk Score and Plan: 3 and Ondansetron, Dexamethasone and Treatment may vary due to age or medical condition  Airway Management Planned: Oral ETT  Additional Equipment: None  Intra-op Plan:   Post-operative Plan: Extubation in OR  Informed Consent: I have reviewed the patients History and Physical, chart, labs and discussed the procedure including the risks, benefits and alternatives for the proposed anesthesia with the patient or authorized representative who has indicated his/her understanding and acceptance.     Dental advisory given  Plan Discussed with: CRNA  Anesthesia Plan Comments:        Anesthesia Quick Evaluation

## 2021-08-11 NOTE — H&P (Cosign Needed Addendum)
PREOPERATIVE H&P  Chief Complaint: RIGHT SHOULDER DJD  HPI: Laura Bauer is a 76 y.o. female who is scheduled for Procedure(s): REVERSE SHOULDER ARTHROPLASTY.   Patient has a past medical history significant for HTN and HLD.   Patient a 76 year old who has had right shoulder pain for quite some time. She was seen by other providers who noted there was a rotator cuff tear and there was poor tissue quality noted. She is worried about a repair having a high likelihood of failure. She has done therapy and injections.   Symptoms are rated as moderate to severe, and have been worsening.  This is significantly impairing activities of daily living.    Please see clinic note for further details on this patient's care.    She has elected for surgical management.   Past Medical History:  Diagnosis Date   Arthritis    Chronic bilateral low back pain with bilateral sciatica    Hyperlipidemia    Hypertension    Past Surgical History:  Procedure Laterality Date   BUNIONECTOMY Bilateral    CHOLECYSTECTOMY     CHOLESTEATOMA EXCISION Left    DILATION AND CURETTAGE OF UTERUS     EYE SURGERY Bilateral    cataract   FINGER MASS EXCISION Right    5th finger   thumb arthroplasty Left    Social History   Socioeconomic History   Marital status: Married    Spouse name: Not on file   Number of children: Not on file   Years of education: Not on file   Highest education level: Not on file  Occupational History   Not on file  Tobacco Use   Smoking status: Never   Smokeless tobacco: Never  Substance and Sexual Activity   Alcohol use: Never   Drug use: Never   Sexual activity: Not Currently    Birth control/protection: Post-menopausal  Other Topics Concern   Not on file  Social History Narrative   Not on file   Social Determinants of Health   Financial Resource Strain: Not on file  Food Insecurity: Not on file  Transportation Needs: Not on file  Physical Activity: Not on  file  Stress: Not on file  Social Connections: Not on file   Family History  Problem Relation Age of Onset   Breast cancer Mother 85   No Known Allergies Prior to Admission medications   Medication Sig Start Date End Date Taking? Authorizing Provider  aspirin EC 81 MG tablet Take by mouth.   Yes [provider]  B Complex-C (SUPER B-COMPLEX + VITAMIN C PO) Take by mouth.   Yes [provider]  ezetimibe-simvastatin (VYTORIN) 10-20 MG tablet TAKE ONE TABLET BY MOUTH EVERY DAY 01/29/15  Yes [provider]  Multiple Vitamins-Minerals (CENTRUM SILVER 50+WOMEN PO) Take by mouth.   Yes [provider]  polyethylene glycol (MIRALAX / GLYCOLAX) 17 g packet Take 17 g by mouth daily.   Yes [provider]  saccharomyces boulardii (FLORASTOR) 250 MG capsule Take 250 mg by mouth 2 (two) times daily.   Yes [provider]  traMADol (ULTRAM) 50 MG tablet Take by mouth every 6 (six) hours as needed.   Yes [provider]  valsartan (DIOVAN) 320 MG tablet Take 320 mg by mouth daily.   Yes [provider]  Vitamin D, Ergocalciferol, (DRISDOL) 50000 units CAPS capsule TAKE 1 CAPSULE BY MOUTH two TIMES PER MONTH 12/12/16  Yes [provider]  ROS: All other systems have been reviewed and were otherwise negative with the exception of those mentioned in the HPI and as above.  Physical Exam: General: Alert, no acute distress Cardiovascular: No pedal edema Respiratory: No cyanosis, no use of accessory musculature GI: No organomegaly, abdomen is soft and non-tender Skin: No lesions in the area of chief complaint Neurologic: Sensation intact distally Psychiatric: Patient is competent for consent with normal mood and affect Lymphatic: No axillary or cervical lymphadenopathy  MUSCULOSKELETAL:  Examination the right shoulder range of motion is to 90 degrees. Passive 170. External rotation to 45, internal rotation to the  beltline. She is weak with supraspinatus and infraspinatus testing.  Imaging:  MRI is reviewed and this is imported into our system. 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 There is a significant amount of signal around the greater tuberosity consistent with chronic impingement.    BMI: Estimated body mass index is 33.89 kg/m as calculated from the following:   Height as of this encounter: 5\' 6"  (1.676 m).   Weight as of this encounter: 95.3 kg.  No results found for: "ALBUMIN" Diabetes: Patient does not have a diagnosis of diabetes.     Smoking Status:   reports that she has never smoked. She has never used smokeless tobacco.     Assessment: RIGHT SHOULDER DJD  Plan: Plan for Procedure(s): REVERSE SHOULDER ARTHROPLASTY   The risks benefits and alternatives were discussed with the patient including but not limited to the risks of nonoperative treatment, versus surgical intervention including infection, bleeding, nerve injury,  blood clots, cardiopulmonary complications, morbidity, mortality, among others, and they were willing to proceed.   We additionally specifically discussed risks of axillary nerve injury, infection, periprosthetic fracture, continued pain and longevity of implants prior to beginning procedure.    Patient will be closely monitored in PACU for medical stabilization and pain control. If found stable in PACU, patient may be discharged home with outpatient follow-up. If any concerns regarding patient's stabilization patient will be admitted for observation after surgery. The patient is planning to be discharged home with outpatient PT.   The patient acknowledged the explanation, agreed to proceed with the plan and consent was signed.   She received operative clearance from her PCP, Dr. .   Operative Plan: Right reverse total shoulder arthroplasty Discharge Medications: Standard DVT  Prophylaxis: Aspirin Physical Therapy: Outpatient PT - [facility] [place] Special Discharge needs: Sling. IceMan   Juleen China, PA-C  08/11/2021 9:05 AM

## 2021-08-11 NOTE — Discharge Instructions (Addendum)
Ramond Marrow MD, MPH Alfonse Alpers, PA-C [facility] [address] [phone] (tel)   [phone] (fax)   POST-OPERATIVE INSTRUCTIONS - TOTAL SHOULDER REPLACEMENT    WOUND CARE You may leave the operative dressing in place until your follow-up appointment. KEEP THE INCISIONS CLEAN AND DRY. There may be a small amount of fluid/bleeding leaking at the surgical site. This is normal after surgery.  If it fills with liquid or blood please call us immediately to change it for you. Use the provided ice machine or Ice packs as often as possible for the first 3-4 days, then as needed for pain relief.   Keep a layer of cloth or a shirt between your skin and the cooling unit to prevent frost bite as it can get very cold.  SHOWERING: - You may shower on Post-Op Day #2.  - The dressing is water resistant but do not scrub it as it may start to peel up.   - You may remove the sling for showering - Gently pat the area dry.  - Do not soak the shoulder in water.  - Do not go swimming in the pool or ocean until your incision has completely healed (about 4-6 weeks after surgery) - KEEP THE INCISIONS CLEAN AND DRY.  EXERCISES Wear the sling at all times  You may remove the sling for showering, but keep the arm across the chest or in a secondary sling.    Accidental/Purposeful External Rotation and shoulder flexion (reaching behind you) is to be avoided at all costs for the first month. It is ok to come out of your sling if your are sitting and have assistance for eating.   Do not lift anything heavier than 1 pound until we discuss it further in clinic.  It is normal for your fingers/hand to become more swollen after surgery and discolored from bruising.   This will resolve over the first few weeks usually after surgery. Please continue to ambulate and do not stay sitting or lying for too long.  Perform foot and wrist pumps to assist in circulation.  PHYSICAL  THERAPY - You will begin physical therapy soon after surgery (unless otherwise specified) - Please call to set up an appointment, if you do not already have one  - Let our office if there are any issues with scheduling your therapy  - A PT referral was sent to [facility] in [place]  REGIONAL ANESTHESIA (NERVE BLOCKS) The anesthesia team may have performed a nerve block for you this is a great tool used to minimize pain.   The block may start wearing off overnight (between 8-24 hours postop) When the block wears off, your pain may go from nearly zero to the pain you would have had postop without the block. This is an abrupt transition but nothing dangerous is happening.   This can be a challenging period but utilize your as needed pain medications to try and manage this period. We suggest you use the pain medication the first night prior to going to bed, to ease this transition.  You may take an extra dose of narcotic when this happens if needed   POST-OP MEDICATIONS- Multimodal approach to pain control In general your pain will be controlled with a combination of substances.  Prescriptions unless otherwise discussed are electronically sent to your pharmacy.  This is a carefully made plan we use to minimize narcotic use.      Acetaminophen - Non-narcotic pain medicine taken  on a scheduled basis  Oxycodone - This is a strong narcotic, to be used only on an "as needed" basis for SEVERE pain. Aspirin 81mg  - This medicine is used to minimize the risk of blood clots after surgery. Omeprazole - daily medicine to protect your stomach while taking anti-inflammatories.   Zofran -  take as needed for nausea   FOLLOW-UP If you develop a Fever (>101.5), Redness or Drainage from the surgical incision site, please call our office to arrange for an evaluation. Please call the office to schedule a follow-up appointment for a wound check, 7-10 days post-operatively.  IF YOU HAVE ANY QUESTIONS,  PLEASE FEEL FREE TO CALL OUR OFFICE.  HELPFUL INFORMATION  Your arm will be in a sling following surgery. You will be in this sling for the next 4 weeks.   You may be more comfortable sleeping in a semi-seated position the first few nights following surgery.  Keep a pillow propped under the elbow and forearm for comfort.  If you have a recliner type of chair it might be beneficial.  If not that is fine too, but it would be helpful to sleep propped up with pillows behind your operated shoulder as well under your elbow and forearm.  This will reduce pulling on the suture lines.  When dressing, put your operative arm in the sleeve first.  When getting undressed, take your operative arm out last.  Loose fitting, button-down shirts are recommended.  In most states it is against the law to drive while your arm is in a sling. And certainly against the law to drive while taking narcotics.  You may return to work/school in the next couple of days when you feel up to it. Desk work and typing in the sling is fine.  We suggest you use the pain medication the first night prior to going to bed, in order to ease any pain when the anesthesia wears off. You should avoid taking pain medications on an empty stomach as it will make you nauseous.  You should wean off your narcotic medicines as soon as you are able.     Most patients will be off or using minimal narcotics before their first postop appointment.   Do not drink alcoholic beverages or take illicit drugs when taking pain medications.  Pain medication may make you constipated.  Below are a few solutions to try in this order: Decrease the amount of pain medication if you aren't having pain. Drink lots of decaffeinated fluids. Drink prune juice and/or each dried prunes  If the first 3 don't work start with additional solutions Take Colace - an over-the-counter stool softener Take Senokot - an over-the-counter laxative Take Miralax - a stronger  over-the-counter laxative   Dental Antibiotics:  In most cases prophylactic antibiotics for Dental procdeures after total joint surgery are not necessary.  Exceptions are as follows:  1. History of prior total joint infection  2. Severely immunocompromised (Organ Transplant, cancer chemotherapy, Rheumatoid biologic meds such as Humera)  3. Poorly controlled diabetes (A1C &gt; 8.0, blood glucose over 200)  If you have one of these conditions, contact your surgeon for an antibiotic prescription, prior to your dental procedure.   For more information including helpful videos and documents visit our website:   [email or web address]  Information for Discharge Teaching: EXPAREL (bupivacaine liposome injectable suspension)   Your surgeon or anesthesiologist gave you EXPAREL(bupivacaine) to help control your pain after surgery.  EXPAREL is a local anesthetic that provides pain relief by  numbing the tissue around the surgical site. EXPAREL is designed to release pain medication over time and can control pain for up to 72 hours. Depending on how you respond to EXPAREL, you may require less pain medication during your recovery.  Possible side effects: Temporary loss of sensation or ability to move in the area where bupivacaine was injected. Nausea, vomiting, constipation Rarely, numbness and tingling in your mouth or lips, lightheadedness, or anxiety may occur. Call your doctor right away if you think you may be experiencing any of these sensations, or if you have other questions regarding possible side effects.  Follow all other discharge instructions given to you by your surgeon or nurse. Eat a healthy diet and drink plenty of water or other fluids.  If you return to the hospital for any reason within 96 hours following the administration of EXPAREL, it is important for health care providers to know that you have received this anesthetic. A teal colored  band has been placed on your arm with the date, time and amount of EXPAREL you have received in order to alert and inform your health care providers. Please leave this armband in place for the full 96 hours following administration, and then you may remove the band.    Post Anesthesia Home Care Instructions  Activity: Get plenty of rest for the remainder of the day. A responsible individual must stay with you for 24 hours following the procedure.  For the next 24 hours, DO NOT: -Drive a car -Advertising copywriter -Drink alcoholic beverages -Take any medication unless instructed by your physician -Make any legal decisions or sign important papers.  Meals: Start with liquid foods such as gelatin or soup. Progress to regular foods as tolerated. Avoid greasy, spicy, heavy foods. If nausea and/or vomiting occur, drink only clear liquids until the nausea and/or vomiting subsides. Call your physician if vomiting continues.  Special Instructions/Symptoms: Your throat may feel dry or sore from the anesthesia or the breathing tube placed in your throat during surgery. If this causes discomfort, gargle with warm salt water. The discomfort should disappear within 24 hours.

## 2021-08-12 ENCOUNTER — Ambulatory Visit (HOSPITAL_BASED_OUTPATIENT_CLINIC_OR_DEPARTMENT_OTHER)
Admission: RE | Admit: 2021-08-12 | Discharge: 2021-08-12 | Disposition: A | Discharge status: Home / Self Care | Payer: Medicare PPO | Attending: Orthopaedic Surgery | Admitting: Orthopaedic Surgery

## 2021-08-12 ENCOUNTER — Ambulatory Visit (HOSPITAL_BASED_OUTPATIENT_CLINIC_OR_DEPARTMENT_OTHER): Payer: Medicare PPO | Admitting: Anesthesiology

## 2021-08-12 ENCOUNTER — Encounter (HOSPITAL_BASED_OUTPATIENT_CLINIC_OR_DEPARTMENT_OTHER): Payer: Self-pay | Admitting: Orthopaedic Surgery

## 2021-08-12 ENCOUNTER — Ambulatory Visit (HOSPITAL_COMMUNITY): Payer: Medicare PPO

## 2021-08-12 ENCOUNTER — Encounter (HOSPITAL_BASED_OUTPATIENT_CLINIC_OR_DEPARTMENT_OTHER)
Admission: RE | Disposition: A | Discharge status: Home / Self Care | Payer: Self-pay | Source: Home / Self Care | Attending: Orthopaedic Surgery

## 2021-08-12 ENCOUNTER — Other Ambulatory Visit: Payer: Self-pay

## 2021-08-12 DIAGNOSIS — M19011 Primary osteoarthritis, right shoulder: Secondary | ICD-10-CM | POA: Insufficient documentation

## 2021-08-12 DIAGNOSIS — I10 Essential (primary) hypertension: Secondary | ICD-10-CM | POA: Diagnosis not present

## 2021-08-12 DIAGNOSIS — J439 Emphysema, unspecified: Secondary | ICD-10-CM | POA: Diagnosis not present

## 2021-08-12 DIAGNOSIS — Z96611 Presence of right artificial shoulder joint: Secondary | ICD-10-CM | POA: Diagnosis not present

## 2021-08-12 DIAGNOSIS — E785 Hyperlipidemia, unspecified: Secondary | ICD-10-CM | POA: Diagnosis not present

## 2021-08-12 DIAGNOSIS — M75121 Complete rotator cuff tear or rupture of right shoulder, not specified as traumatic: Secondary | ICD-10-CM | POA: Diagnosis not present

## 2021-08-12 DIAGNOSIS — G8918 Other acute postprocedural pain: Secondary | ICD-10-CM | POA: Diagnosis not present

## 2021-08-12 DIAGNOSIS — Z471 Aftercare following joint replacement surgery: Secondary | ICD-10-CM | POA: Diagnosis not present

## 2021-08-12 DIAGNOSIS — M75101 Unspecified rotator cuff tear or rupture of right shoulder, not specified as traumatic: Secondary | ICD-10-CM

## 2021-08-12 DIAGNOSIS — Z01818 Encounter for other preprocedural examination: Secondary | ICD-10-CM

## 2021-08-12 HISTORY — DX: Essential (primary) hypertension: I10

## 2021-08-12 HISTORY — PX: REVERSE SHOULDER ARTHROPLASTY: SHX5054

## 2021-08-12 HISTORY — DX: Hyperlipidemia, unspecified: E78.5

## 2021-08-12 HISTORY — DX: Unspecified osteoarthritis, unspecified site: M19.90

## 2021-08-12 HISTORY — DX: Other chronic pain: G89.29

## 2021-08-12 SURGERY — ARTHROPLASTY, SHOULDER, TOTAL, REVERSE
Anesthesia: Regional | Site: Shoulder | Laterality: Right

## 2021-08-12 MED ORDER — PROPOFOL 10 MG/ML IV BOLUS
INTRAVENOUS | Status: DC | PRN
  Administered 2021-08-12: 140 mg via INTRAVENOUS

## 2021-08-12 MED ORDER — MIDAZOLAM HCL 2 MG/2ML IJ SOLN
INTRAMUSCULAR | Status: AC
  Filled 2021-08-12: qty 2

## 2021-08-12 MED ORDER — OMEPRAZOLE 20 MG PO CPDR: 20.0000 mg | DELAYED_RELEASE_CAPSULE | Freq: Every day | ORAL | 0 refills | Status: AC

## 2021-08-12 MED ORDER — ASPIRIN 81 MG PO CHEW: 81.0000 mg | CHEWABLE_TABLET | Freq: Two times a day (BID) | ORAL | 0 refills | Status: AC

## 2021-08-12 MED ORDER — CEFAZOLIN SODIUM-DEXTROSE 2-4 GM/100ML-% IV SOLN
2.0000 g | INTRAVENOUS | Status: AC
  Administered 2021-08-12: 2 g via INTRAVENOUS

## 2021-08-12 MED ORDER — FENTANYL CITRATE (PF) 100 MCG/2ML IJ SOLN
INTRAMUSCULAR | Status: DC | PRN
  Administered 2021-08-12: 50 ug via INTRAVENOUS

## 2021-08-12 MED ORDER — ROPIVACAINE HCL 5 MG/ML IJ SOLN
INTRAMUSCULAR | Status: DC | PRN
  Administered 2021-08-12: 15 mL via PERINEURAL

## 2021-08-12 MED ORDER — SUGAMMADEX SODIUM 200 MG/2ML IV SOLN
INTRAVENOUS | Status: DC | PRN
  Administered 2021-08-12: 200 mg via INTRAVENOUS

## 2021-08-12 MED ORDER — ONDANSETRON HCL 4 MG/2ML IJ SOLN
INTRAMUSCULAR | Status: DC | PRN
  Administered 2021-08-12: 4 mg via INTRAVENOUS

## 2021-08-12 MED ORDER — DEXAMETHASONE SODIUM PHOSPHATE 4 MG/ML IJ SOLN
INTRAMUSCULAR | Status: DC | PRN
  Administered 2021-08-12: 5 mg via INTRAVENOUS

## 2021-08-12 MED ORDER — TRANEXAMIC ACID-NACL 1000-0.7 MG/100ML-% IV SOLN
INTRAVENOUS | Status: AC
  Filled 2021-08-12: qty 100

## 2021-08-12 MED ORDER — EPHEDRINE 5 MG/ML INJ
INTRAVENOUS | Status: AC
  Filled 2021-08-12: qty 5

## 2021-08-12 MED ORDER — OXYCODONE HCL 5 MG PO TABS: ORAL_TABLET | ORAL | 0 refills | Status: AC

## 2021-08-12 MED ORDER — FENTANYL CITRATE (PF) 100 MCG/2ML IJ SOLN: 25.0000 ug | INTRAMUSCULAR | Status: DC | PRN

## 2021-08-12 MED ORDER — AMISULPRIDE (ANTIEMETIC) 5 MG/2ML IV SOLN: 10.0000 mg | Freq: Once | INTRAVENOUS | Status: DC | PRN

## 2021-08-12 MED ORDER — ROCURONIUM BROMIDE 10 MG/ML (PF) SYRINGE
PREFILLED_SYRINGE | INTRAVENOUS | Status: AC
  Filled 2021-08-12: qty 10

## 2021-08-12 MED ORDER — GABAPENTIN 300 MG PO CAPS
ORAL_CAPSULE | ORAL | Status: AC
  Filled 2021-08-12: qty 1

## 2021-08-12 MED ORDER — PHENYLEPHRINE HCL (PRESSORS) 10 MG/ML IV SOLN
INTRAVENOUS | Status: AC
  Filled 2021-08-12: qty 1

## 2021-08-12 MED ORDER — FENTANYL CITRATE (PF) 100 MCG/2ML IJ SOLN
INTRAMUSCULAR | Status: AC
  Filled 2021-08-12: qty 2

## 2021-08-12 MED ORDER — PHENYLEPHRINE HCL-NACL 20-0.9 MG/250ML-% IV SOLN
INTRAVENOUS | Status: DC | PRN
  Administered 2021-08-12: 50 ug/min via INTRAVENOUS

## 2021-08-12 MED ORDER — VANCOMYCIN HCL 1000 MG IV SOLR
INTRAVENOUS | Status: AC
  Filled 2021-08-12: qty 20

## 2021-08-12 MED ORDER — CEFAZOLIN SODIUM-DEXTROSE 2-4 GM/100ML-% IV SOLN
INTRAVENOUS | Status: AC
  Filled 2021-08-12: qty 100

## 2021-08-12 MED ORDER — GABAPENTIN 300 MG PO CAPS
300.0000 mg | ORAL_CAPSULE | Freq: Once | ORAL | Status: AC
  Administered 2021-08-12: 300 mg via ORAL

## 2021-08-12 MED ORDER — LACTATED RINGERS IV SOLN: INTRAVENOUS | Status: DC

## 2021-08-12 MED ORDER — PROPOFOL 10 MG/ML IV BOLUS
INTRAVENOUS | Status: AC
  Filled 2021-08-12: qty 20

## 2021-08-12 MED ORDER — OXYCODONE HCL 5 MG PO TABS: 5.0000 mg | ORAL_TABLET | Freq: Once | ORAL | Status: DC | PRN

## 2021-08-12 MED ORDER — EPHEDRINE SULFATE (PRESSORS) 50 MG/ML IJ SOLN
INTRAMUSCULAR | Status: DC | PRN
  Administered 2021-08-12: 15 mg via INTRAVENOUS

## 2021-08-12 MED ORDER — ONDANSETRON HCL 4 MG/2ML IJ SOLN
INTRAMUSCULAR | Status: AC
  Filled 2021-08-12: qty 2

## 2021-08-12 MED ORDER — ACETAMINOPHEN 500 MG PO TABS
1000.0000 mg | ORAL_TABLET | Freq: Once | ORAL | Status: AC
  Administered 2021-08-12: 1000 mg via ORAL

## 2021-08-12 MED ORDER — BUPIVACAINE LIPOSOME 1.3 % IJ SUSP
INTRAMUSCULAR | Status: DC | PRN
  Administered 2021-08-12: 10 mL via PERINEURAL

## 2021-08-12 MED ORDER — OXYCODONE HCL 5 MG/5ML PO SOLN: 5.0000 mg | Freq: Once | ORAL | Status: DC | PRN

## 2021-08-12 MED ORDER — BUPIVACAINE HCL (PF) 0.25 % IJ SOLN
INTRAMUSCULAR | Status: AC
  Filled 2021-08-12: qty 30

## 2021-08-12 MED ORDER — ROCURONIUM BROMIDE 100 MG/10ML IV SOLN
INTRAVENOUS | Status: DC | PRN
  Administered 2021-08-12: 60 mg via INTRAVENOUS

## 2021-08-12 MED ORDER — LIDOCAINE HCL (CARDIAC) PF 100 MG/5ML IV SOSY
PREFILLED_SYRINGE | INTRAVENOUS | Status: DC | PRN
  Administered 2021-08-12: 40 mg via INTRAVENOUS

## 2021-08-12 MED ORDER — LACTATED RINGERS IV BOLUS: 500.0000 mL | Freq: Once | INTRAVENOUS | Status: DC

## 2021-08-12 MED ORDER — TRANEXAMIC ACID-NACL 1000-0.7 MG/100ML-% IV SOLN
1000.0000 mg | INTRAVENOUS | Status: AC
  Administered 2021-08-12: 1000 mg via INTRAVENOUS

## 2021-08-12 MED ORDER — LACTATED RINGERS IV BOLUS: 250.0000 mL | Freq: Once | INTRAVENOUS | Status: DC

## 2021-08-12 MED ORDER — ACETAMINOPHEN 500 MG PO TABS: 1000.0000 mg | ORAL_TABLET | Freq: Once | ORAL | Status: DC

## 2021-08-12 MED ORDER — ONDANSETRON HCL 4 MG PO TABS: 4.0000 mg | ORAL_TABLET | Freq: Three times a day (TID) | ORAL | 0 refills | Status: AC | PRN

## 2021-08-12 MED ORDER — VANCOMYCIN HCL 1000 MG IV SOLR
INTRAVENOUS | Status: DC | PRN
  Administered 2021-08-12: 1000 mg via TOPICAL

## 2021-08-12 MED ORDER — ACETAMINOPHEN 500 MG PO TABS: 1000.0000 mg | ORAL_TABLET | Freq: Three times a day (TID) | ORAL | 0 refills | Status: AC

## 2021-08-12 MED ORDER — ACETAMINOPHEN 500 MG PO TABS
ORAL_TABLET | ORAL | Status: AC
  Filled 2021-08-12: qty 2

## 2021-08-12 MED ORDER — ONDANSETRON HCL 4 MG/2ML IJ SOLN: 4.0000 mg | Freq: Once | INTRAMUSCULAR | Status: DC | PRN

## 2021-08-12 MED ORDER — DEXAMETHASONE SODIUM PHOSPHATE 10 MG/ML IJ SOLN
INTRAMUSCULAR | Status: AC
  Filled 2021-08-12: qty 1

## 2021-08-12 MED ORDER — 0.9 % SODIUM CHLORIDE (POUR BTL) OPTIME
TOPICAL | Status: DC | PRN
  Administered 2021-08-12: 1000 mL

## 2021-08-12 MED ORDER — LIDOCAINE 2% (20 MG/ML) 5 ML SYRINGE
INTRAMUSCULAR | Status: AC
  Filled 2021-08-12: qty 5

## 2021-08-12 MED ORDER — FENTANYL CITRATE (PF) 100 MCG/2ML IJ SOLN
50.0000 ug | Freq: Once | INTRAMUSCULAR | Status: AC
  Administered 2021-08-12: 50 ug via INTRAVENOUS

## 2021-08-12 SURGICAL SUPPLY — 70 items
AID PSTN UNV HD RSTRNT DISP (MISCELLANEOUS) ×1
APL PRP STRL LF DISP 70% ISPRP (MISCELLANEOUS) ×1
BASEPLATE GLENOID RSA 3X25 0D (Shoulder) ×1 IMPLANT
BIT DRILL 3.2 PERIPHERAL SCREW (BIT) ×1 IMPLANT
BLADE HEX COATED 2.75 (ELECTRODE) IMPLANT
BLADE SAW SGTL 73X25 THK (BLADE) ×2 IMPLANT
BLADE SURG 10 STRL SS (BLADE) IMPLANT
BLADE SURG 15 STRL LF DISP TIS (BLADE) IMPLANT
BLADE SURG 15 STRL SS (BLADE)
BNDG COHESIVE 4X5 TAN ST LF (GAUZE/BANDAGES/DRESSINGS) IMPLANT
BRUSH SCRUB EZ PLAIN DRY (MISCELLANEOUS) ×2 IMPLANT
BSPLAT GLND +3 25 (Shoulder) ×1 IMPLANT
CHLORAPREP W/TINT 26 (MISCELLANEOUS) ×2 IMPLANT
CLSR STERI-STRIP ANTIMIC 1/2X4 (GAUZE/BANDAGES/DRESSINGS) ×2 IMPLANT
COOLER ICEMAN CLASSIC (MISCELLANEOUS) ×2 IMPLANT
COVER BACK TABLE 60X90IN (DRAPES) ×2 IMPLANT
COVER MAYO STAND STRL (DRAPES) ×2 IMPLANT
DRAPE IMP U-DRAPE 54X76 (DRAPES) ×2 IMPLANT
DRAPE INCISE IOBAN 66X45 STRL (DRAPES) ×2 IMPLANT
DRAPE U-SHAPE 76X120 STRL (DRAPES) ×4 IMPLANT
DRSG AQUACEL AG ADV 3.5X 6 (GAUZE/BANDAGES/DRESSINGS) ×2 IMPLANT
ELECT BLADE 4.0 EZ CLEAN MEGAD (MISCELLANEOUS) ×2
ELECT REM PT RETURN 9FT ADLT (ELECTROSURGICAL) ×2
ELECTRODE BLDE 4.0 EZ CLN MEGD (MISCELLANEOUS) ×1 IMPLANT
ELECTRODE REM PT RTRN 9FT ADLT (ELECTROSURGICAL) ×1 IMPLANT
FACESHIELD WRAPAROUND (MASK) ×4 IMPLANT
FACESHIELD WRAPAROUND OR TEAM (MASK) ×2 IMPLANT
GLENOSPHERE CANN SHLD +3 36 (Shoulder) ×1 IMPLANT
GLOVE BIO SURGEON STRL SZ 6.5 (GLOVE) ×4 IMPLANT
GLOVE BIOGEL PI IND STRL 6.5 (GLOVE) ×1 IMPLANT
GLOVE BIOGEL PI IND STRL 8 (GLOVE) ×1 IMPLANT
GLOVE BIOGEL PI INDICATOR 6.5 (GLOVE) ×1
GLOVE BIOGEL PI INDICATOR 8 (GLOVE) ×1
GLOVE ECLIPSE 8.0 STRL XLNG CF (GLOVE) ×4 IMPLANT
GOWN STRL REUS W/ TWL LRG LVL3 (GOWN DISPOSABLE) ×2 IMPLANT
GOWN STRL REUS W/TWL LRG LVL3 (GOWN DISPOSABLE) ×4
GOWN STRL REUS W/TWL XL LVL3 (GOWN DISPOSABLE) ×2 IMPLANT
GUIDE PIN 3X75 SHOULDER (PIN) ×2
GUIDEWIRE GLENOID 2.5X220 (WIRE) ×2 IMPLANT
HANDPIECE INTERPULSE COAX TIP (DISPOSABLE) ×2
INSERT REVERSED HUMERAL SIZE 1 (Orthopedic Implant) ×1 IMPLANT
KIT SHOULDER STAB MARCO (KITS) ×2 IMPLANT
MANIFOLD NEPTUNE II (INSTRUMENTS) ×2 IMPLANT
PACK BASIN DAY SURGERY FS (CUSTOM PROCEDURE TRAY) ×2 IMPLANT
PACK SHOULDER (CUSTOM PROCEDURE TRAY) ×2 IMPLANT
PAD COLD SHLDR WRAP-ON (PAD) ×2 IMPLANT
PAD ORTHO SHOULDER 7X19 LRG (SOFTGOODS) ×2 IMPLANT
PENCIL SMOKE EVACUATOR (MISCELLANEOUS) IMPLANT
PIN GUIDE 3X75 SHOULDER (PIN) IMPLANT
RESTRAINT HEAD UNIVERSAL NS (MISCELLANEOUS) ×2 IMPLANT
SCREW 5.0X18 (Screw) ×1 IMPLANT
SCREW 5.0X38 SMALL F/PERFORM (Screw) ×1 IMPLANT
SCREW BONE 6.5X40 SM (Screw) ×1 IMPLANT
SET HNDPC FAN SPRY TIP SCT (DISPOSABLE) ×1 IMPLANT
SHEET MEDIUM DRAPE 40X70 STRL (DRAPES) ×2 IMPLANT
SLEEVE SCD COMPRESS KNEE MED (STOCKING) ×2 IMPLANT
SPIKE FLUID TRANSFER (MISCELLANEOUS) IMPLANT
SPONGE T-LAP 18X18 ~~LOC~~+RFID (SPONGE) ×2 IMPLANT
STEM HUMERAL PLUS SHORT SZ2+ (Orthopedic Implant) ×1 IMPLANT
SUT ETHIBOND 2 V 37 (SUTURE) ×2 IMPLANT
SUT ETHIBOND NAB CT1 #1 30IN (SUTURE) ×2 IMPLANT
SUT FIBERWIRE #5 38 CONV NDL (SUTURE) ×8
SUT MNCRL AB 4-0 PS2 18 (SUTURE) ×2 IMPLANT
SUT VIC AB 0 CT1 27 (SUTURE) ×2
SUT VIC AB 0 CT1 27XBRD ANBCTR (SUTURE) IMPLANT
SUT VIC AB 3-0 SH 27 (SUTURE) ×2
SUT VIC AB 3-0 SH 27X BRD (SUTURE) ×1 IMPLANT
SUTURE FIBERWR #5 38 CONV NDL (SUTURE) ×4 IMPLANT
TOWEL GREEN STERILE FF (TOWEL DISPOSABLE) ×6 IMPLANT
TUBE SUCTION HIGH CAP CLEAR NV (SUCTIONS) ×2 IMPLANT

## 2021-08-12 NOTE — Op Note (Signed)
Orthopaedic Surgery Operative Note (CSN: [id number])  Laura Bauer  03-Jun-1945 Date of Surgery: 08/12/2021   Diagnoses:  Right shoulder irreparable cuff tear  Procedure: Right reverse lateralized total Shoulder Arthroplasty   Operative Finding Successful completion of planned procedure.  Overall reasonable bone quality, good fixation, good stability.  Superior cuff was completely torn but subscapularis was intact, actually nerve tug test was normal.  Post-operative plan: The patient will be NWB in sling.  The patient will be will be discharged from PACU if continues to be stable as was plan prior to surgery.  DVT prophylaxis Aspirin 81 mg twice daily for 6 weeks.  Pain control with PRN pain medication preferring oral medicines.  Follow up plan will be scheduled in approximately 7 days for incision check and XR.  Physical therapy to start mediately.  Implants: Tornier perform humeral size 2+ stem, 0 polyethylene, 36+3 glenosphere, 25+3 baseplate, 40 center screw with 2 peripheral locking screws  Post-Op Diagnosis: Same Surgeons:Primary: Bjorn Pippin, MD Assistants:Caroline McBane PA-C Location: [facility] OR ROOM 1 Anesthesia: General with Exparel Interscalene Antibiotics: Ancef 2g preop, Vancomycin 1000mg  locally Tourniquet time: None Estimated Blood Loss: 100 Complications: None Specimens: None Implants: Implant Name Type Inv. Item Serial No. Manufacturer Lot No. LRB No. Used Action  BASEPLATE GLENOID RSA 3X25 0D - Shoulder BASEPLATE GLENOID RSA 3X25 0D [id number] [employer]  Right 1 Implanted  SCREW BONE 6.5X40 SM - [id number] Screw SCREW BONE 6.5X40 SM  [employer]  Right 1 Implanted  SCREW 5.0X38 SMALL F/PERFORM - [id number] Screw SCREW 5.0X38 SMALL F/PERFORM  [employer]  Right 1 Implanted  GLENOSPHERE CANN SHLD +3 36 - [id number] Shoulder GLENOSPHERE CANN SHLD +3 36 [id number] [employer]  Right 1 Implanted  INSERT REVERSED HUMERAL SIZE 1 - [id number] Orthopedic  Implant INSERT REVERSED HUMERAL SIZE 1 [id number] [employer]  Right 1 Implanted  STEM HUMERAL PLUS SHORT SZ2+ - [id number] Orthopedic Implant STEM HUMERAL PLUS SHORT SZ2+ [id number] [employer]  Right 1 Implanted    Indications for Surgery:   Laura Bauer is a 76 y.o. female with irreparable cuff tear.  Benefits and risks of operative and nonoperative management were discussed prior to surgery with patient/guardian(s) and informed consent form was completed.  Infection and need for further surgery were discussed as was prosthetic stability and cuff issues.  We additionally specifically discussed risks of axillary nerve injury, infection, periprosthetic fracture, continued pain and longevity of implants prior to beginning procedure.      Procedure:   The patient was identified in the preoperative holding area where the surgical site was marked. Block placed by anesthesia with exparel.  The patient was taken to the OR where a procedural timeout was called and the above noted anesthesia was induced.  The patient was positioned beachchair on allen table with spider arm positioner.  Preoperative antibiotics were dosed.  The patient's right shoulder was prepped and draped in the usual sterile fashion.  A second preoperative timeout was called.       Standard deltopectoral approach was performed with a #10 blade. We dissected down to the subcutaneous tissues and the cephalic vein was taken laterally with the deltoid. Clavipectoral fascia was incised in line with the incision. Deep retractors were placed. The long of the biceps tendon was identified and there was significant tenosynovitis present.  Tenodesis was performed to the pectoralis tendon with #2 Ethibond. The remaining biceps was followed up into the rotator interval where it was released.   The  subscapularis was taken down in a full thickness layer with capsule along the humeral neck extending inferiorly around the humeral head. We continued  releasing the capsule directly off of the osteophytes inferiorly all the way around the corner. This allowed [place] to dislocate the humeral head.   The rotator cuff was carefully examined and noted to be irreperably torn.  The decision was confirmed that a reverse total shoulder was indicated for this patient.  There were osteophytes along the inferior humeral neck. The osteophytes were removed with an osteotome and a rongeur.  Osteophytes were removed with a rongeur and an osteotome and the anatomic neck was well visualized.     A humeral cutting guide was used extra medullary with a pin to help control version. The version was set at 20 of retroversion. Humeral osteotomy was performed with an oscillating saw. The head fragment was passed off the back table.  A cut protector plate was placed.  The subscapularis was again identified and immediately we took care to palpate the axillary nerve anteriorly and verify its position with gentle palpation as well as the tug test.  We then released the SGHL with bovie cautery prior to placing a curved mayo at the junction of the anterior glenoid well above the axillary nerve and bluntly dissecting the subscapularis from the capsule.  We then carefully protected the axillary nerve as we gently released the inferior capsule to fully mobilize the subscapularis.  An anterior deltoid retractor was then placed as well as a small Hohmann retractor superiorly.   The glenoid was relatively intact in the setting of an irreparable cuff tear  The remaining labrum was removed circumferentially taking great care not to disrupt the posterior capsule.   The glenoid drill guide was placed and used to drill a guide pin in the center, inferior position. The glenoid face was then reamed concentrically over the guide wire. The center hole was drilled over the guidepin in a near anatomic angle of version. Next the glenoid vault was drilled back to a depth of 40 mm.  We tapped and then  placed a 64mm size baseplate with additional 35mm lateralization was selected with a 6.5 mm x 40 mm length central screw.  The base plate was screwed into the glenoid vault obtaining secure fixation. We next placed superior and inferior locking screws for additional fixation.  Next a 36+3 mm glenosphere was selected and impacted onto the baseplate. The center screw was tightened.  We turned attention back to the humeral side. The cut protector was removed.  We used the perform humeral sizing block to select the appropriate size which for this patient was a 2.  We then placed our center pin and reamed over it concentrically obtaining appropriate inset.  We then used our lateralizing chisel to prepare the lateral aspect of the humerus.  At that point we selected the appropriate implant trialing a 2+.  Using this trial implant we trialed multiple polyethylene sizes settling on a 0 which provided good stability and range of motion without excess soft tissue tension. The offset was dialed in to match the normal anatomy. The shoulder was trialed.  There was good ROM in all planes and the shoulder was stable with no inferior translation.  The real humeral implants were opened after again confirming sizes.  The trial was removed. #5 Fiberwire x4 sutures passed through the humeral neck for subscap repair. The humeral component was press-fit obtaining a secure fit. The joint was reduced and thoroughly  irrigated with pulsatile lavage. Subscap was repaired back with #5 Fiberwire sutures through bone tunnels. Hemostasis was obtained. The deltopectoral interval was reapproximated with #1 Ethibond. The subcutaneous tissues were closed with 2-0 Vicryl and the skin was closed with running monocryl.    The wounds were cleaned and dried and an Aquacel dressing was placed. The drapes taken down. The arm was placed into sling with abduction pillow. Patient was awakened, extubated, and transferred to the recovery room in stable  condition. There were no intraoperative complications. The sponge, needle, and attention counts were  correct at the end of the case.     Noemi Chapel, PA-C, present and scrubbed throughout the case, critical for completion in a timely fashion, and for retraction, instrumentation, closure.

## 2021-08-12 NOTE — Interval H&P Note (Signed)
All questions answered, patient wants to proceed with procedure. ? ?

## 2021-08-12 NOTE — Anesthesia Procedure Notes (Signed)
Procedure Name: Intubation Date/Time: 08/12/2021 8:33 AM  Performed by: Thornell Mule, CRNAPre-anesthesia Checklist: Patient identified, Emergency Drugs available, Suction available and Patient being monitored Patient Re-evaluated:Patient Re-evaluated prior to induction Oxygen Delivery Method: Circle system utilized Preoxygenation: Pre-oxygenation with 100% oxygen Induction Type: IV induction Ventilation: Mask ventilation without difficulty Laryngoscope Size: Miller and 3 Grade View: Grade I Tube type: Oral Tube size: 7.0 mm Number of attempts: 1 Airway Equipment and Method: Stylet and Oral airway Placement Confirmation: ETT inserted through vocal cords under direct vision, positive ETCO2 and breath sounds checked- equal and bilateral Secured at: 20 cm Tube secured with: Tape Dental Injury: Teeth and Oropharynx as per pre-operative assessment

## 2021-08-12 NOTE — Anesthesia Procedure Notes (Signed)
Anesthesia Regional Block: Interscalene brachial plexus block   Pre-Anesthetic Checklist: , timeout performed,  Correct Patient, Correct Site, Correct Laterality,  Correct Procedure, Correct Position, site marked,  Risks and benefits discussed,  Surgical consent,  Pre-op evaluation,  At surgeon's request and post-op pain management  Laterality: Right  Prep: Maximum Sterile Barrier Precautions used, chloraprep       Needles:  Injection technique: Single-shot  Needle Type: Echogenic Stimulator Needle     Needle Length: 9cm  Needle Gauge: 22     Additional Needles:   Procedures:,,,, ultrasound used (permanent image in chart),,    Narrative:  Start time: 08/12/2021 8:00 AM End time: 08/12/2021 8:05 AM Injection made incrementally with aspirations every 5 mL.  Performed by: Personally  Anesthesiologist: Lannie Fields, DO  Additional Notes: Monitors applied. No increased pain on injection. No increased resistance to injection. Injection made in 5cc increments. Good needle visualization. Patient tolerated procedure well.

## 2021-08-12 NOTE — Transfer of Care (Signed)
Immediate Anesthesia Transfer of Care Note  Patient: Laura Bauer  Procedure(s) Performed: REVERSE SHOULDER ARTHROPLASTY (Right: Shoulder)  Patient Location: PACU  Anesthesia Type:General  Level of Consciousness: drowsy, patient cooperative and responds to stimulation  Airway & Oxygen Therapy: Patient Spontanous Breathing and Patient connected to face mask oxygen  Post-op Assessment: Report given to RN and Post -op Vital signs reviewed and stable  Post vital signs: Reviewed and stable  Last Vitals:  Vitals Value Taken Time  BP    Temp    Pulse    Resp    SpO2      Last Pain:  Vitals:   08/12/21 0713  TempSrc: Oral  PainSc: 0-No pain      Patients Stated Pain Goal: 3 (08/12/21 0713)  Complications: No notable events documented.

## 2021-08-12 NOTE — Progress Notes (Signed)
Assisted Dr. Finucane with right, interscalene , ultrasound guided block. Side rails up, monitors on throughout procedure. See vital signs in flow sheet. Tolerated Procedure well. 

## 2021-08-12 NOTE — Anesthesia Postprocedure Evaluation (Signed)
Anesthesia Post Note  Patient: Laura Bauer  Procedure(s) Performed: REVERSE SHOULDER ARTHROPLASTY (Right: Shoulder)     Patient location during evaluation: PACU Anesthesia Type: Regional and General Level of consciousness: awake and alert, oriented and patient cooperative Pain management: pain level controlled Vital Signs Assessment: post-procedure vital signs reviewed and stable Respiratory status: spontaneous breathing, nonlabored ventilation and respiratory function stable Cardiovascular status: blood pressure returned to baseline and stable Postop Assessment: no apparent nausea or vomiting Anesthetic complications: no   No notable events documented.  Last Vitals:  Vitals:   08/12/21 1045 08/12/21 1104  BP: 132/65 138/66  Pulse: 64 61  Resp: 16   Temp:  36.6 C  SpO2: 95% 95%    Last Pain:  Vitals:   08/12/21 1104  TempSrc: Oral  PainSc: 0-No pain                 Lannie Fields

## 2021-08-13 ENCOUNTER — Encounter (HOSPITAL_BASED_OUTPATIENT_CLINIC_OR_DEPARTMENT_OTHER): Payer: Self-pay | Admitting: Orthopaedic Surgery

## 2021-08-16 DIAGNOSIS — M25511 Pain in right shoulder: Secondary | ICD-10-CM | POA: Diagnosis not present

## 2021-08-16 DIAGNOSIS — M25611 Stiffness of right shoulder, not elsewhere classified: Secondary | ICD-10-CM | POA: Diagnosis not present

## 2021-08-18 DIAGNOSIS — M25511 Pain in right shoulder: Secondary | ICD-10-CM | POA: Diagnosis not present

## 2021-08-18 DIAGNOSIS — M25611 Stiffness of right shoulder, not elsewhere classified: Secondary | ICD-10-CM | POA: Diagnosis not present

## 2021-08-20 DIAGNOSIS — M19011 Primary osteoarthritis, right shoulder: Secondary | ICD-10-CM | POA: Diagnosis not present

## 2021-08-23 DIAGNOSIS — M25511 Pain in right shoulder: Secondary | ICD-10-CM | POA: Diagnosis not present

## 2021-08-23 DIAGNOSIS — M25611 Stiffness of right shoulder, not elsewhere classified: Secondary | ICD-10-CM | POA: Diagnosis not present

## 2021-08-25 DIAGNOSIS — M25611 Stiffness of right shoulder, not elsewhere classified: Secondary | ICD-10-CM | POA: Diagnosis not present

## 2021-08-25 DIAGNOSIS — M25511 Pain in right shoulder: Secondary | ICD-10-CM | POA: Diagnosis not present

## 2021-08-30 DIAGNOSIS — M25511 Pain in right shoulder: Secondary | ICD-10-CM | POA: Diagnosis not present

## 2021-08-30 DIAGNOSIS — M25611 Stiffness of right shoulder, not elsewhere classified: Secondary | ICD-10-CM | POA: Diagnosis not present

## 2021-09-01 DIAGNOSIS — M25511 Pain in right shoulder: Secondary | ICD-10-CM | POA: Diagnosis not present

## 2021-09-01 DIAGNOSIS — M25611 Stiffness of right shoulder, not elsewhere classified: Secondary | ICD-10-CM | POA: Diagnosis not present

## 2021-09-07 DIAGNOSIS — M25611 Stiffness of right shoulder, not elsewhere classified: Secondary | ICD-10-CM | POA: Diagnosis not present

## 2021-09-07 DIAGNOSIS — M25511 Pain in right shoulder: Secondary | ICD-10-CM | POA: Diagnosis not present

## 2021-09-09 DIAGNOSIS — M25511 Pain in right shoulder: Secondary | ICD-10-CM | POA: Diagnosis not present

## 2021-09-09 DIAGNOSIS — M25611 Stiffness of right shoulder, not elsewhere classified: Secondary | ICD-10-CM | POA: Diagnosis not present

## 2021-09-10 DIAGNOSIS — M19011 Primary osteoarthritis, right shoulder: Secondary | ICD-10-CM | POA: Diagnosis not present

## 2021-09-14 DIAGNOSIS — M25511 Pain in right shoulder: Secondary | ICD-10-CM | POA: Diagnosis not present

## 2021-09-14 DIAGNOSIS — M25611 Stiffness of right shoulder, not elsewhere classified: Secondary | ICD-10-CM | POA: Diagnosis not present

## 2021-09-17 DIAGNOSIS — M25511 Pain in right shoulder: Secondary | ICD-10-CM | POA: Diagnosis not present

## 2021-09-17 DIAGNOSIS — M25611 Stiffness of right shoulder, not elsewhere classified: Secondary | ICD-10-CM | POA: Diagnosis not present

## 2021-09-21 DIAGNOSIS — M25511 Pain in right shoulder: Secondary | ICD-10-CM | POA: Diagnosis not present

## 2021-09-21 DIAGNOSIS — M25611 Stiffness of right shoulder, not elsewhere classified: Secondary | ICD-10-CM | POA: Diagnosis not present

## 2021-09-24 DIAGNOSIS — M25511 Pain in right shoulder: Secondary | ICD-10-CM | POA: Diagnosis not present

## 2021-09-24 DIAGNOSIS — M25611 Stiffness of right shoulder, not elsewhere classified: Secondary | ICD-10-CM | POA: Diagnosis not present

## 2021-09-28 DIAGNOSIS — M25611 Stiffness of right shoulder, not elsewhere classified: Secondary | ICD-10-CM | POA: Diagnosis not present

## 2021-09-28 DIAGNOSIS — M25511 Pain in right shoulder: Secondary | ICD-10-CM | POA: Diagnosis not present

## 2021-09-29 ENCOUNTER — Ambulatory Visit
Admission: RE | Admit: 2021-09-29 | Discharge: 2021-09-29 | Disposition: A | Discharge status: Home / Self Care | Payer: Medicare PPO | Source: Ambulatory Visit | Attending: Family Medicine | Admitting: Family Medicine

## 2021-09-29 DIAGNOSIS — Z1231 Encounter for screening mammogram for malignant neoplasm of breast: Secondary | ICD-10-CM

## 2021-09-30 DIAGNOSIS — M25611 Stiffness of right shoulder, not elsewhere classified: Secondary | ICD-10-CM | POA: Diagnosis not present

## 2021-09-30 DIAGNOSIS — M25511 Pain in right shoulder: Secondary | ICD-10-CM | POA: Diagnosis not present

## 2021-10-05 DIAGNOSIS — M25611 Stiffness of right shoulder, not elsewhere classified: Secondary | ICD-10-CM | POA: Diagnosis not present

## 2021-10-05 DIAGNOSIS — M25511 Pain in right shoulder: Secondary | ICD-10-CM | POA: Diagnosis not present

## 2021-10-08 DIAGNOSIS — H7202 Central perforation of tympanic membrane, left ear: Secondary | ICD-10-CM | POA: Diagnosis not present

## 2021-10-08 DIAGNOSIS — H6983 Other specified disorders of Eustachian tube, bilateral: Secondary | ICD-10-CM | POA: Diagnosis not present

## 2021-10-08 DIAGNOSIS — H906 Mixed conductive and sensorineural hearing loss, bilateral: Secondary | ICD-10-CM | POA: Diagnosis not present

## 2021-10-12 DIAGNOSIS — M25611 Stiffness of right shoulder, not elsewhere classified: Secondary | ICD-10-CM | POA: Diagnosis not present

## 2021-10-12 DIAGNOSIS — M25511 Pain in right shoulder: Secondary | ICD-10-CM | POA: Diagnosis not present

## 2021-10-19 DIAGNOSIS — M25511 Pain in right shoulder: Secondary | ICD-10-CM | POA: Diagnosis not present

## 2021-10-19 DIAGNOSIS — M25611 Stiffness of right shoulder, not elsewhere classified: Secondary | ICD-10-CM | POA: Diagnosis not present

## 2021-10-21 DIAGNOSIS — Z961 Presence of intraocular lens: Secondary | ICD-10-CM | POA: Diagnosis not present

## 2021-10-26 DIAGNOSIS — M25611 Stiffness of right shoulder, not elsewhere classified: Secondary | ICD-10-CM | POA: Diagnosis not present

## 2021-10-26 DIAGNOSIS — M25511 Pain in right shoulder: Secondary | ICD-10-CM | POA: Diagnosis not present

## 2021-11-05 DIAGNOSIS — M19011 Primary osteoarthritis, right shoulder: Secondary | ICD-10-CM | POA: Diagnosis not present

## 2021-11-09 DIAGNOSIS — E7800 Pure hypercholesterolemia, unspecified: Secondary | ICD-10-CM | POA: Diagnosis not present

## 2021-11-09 DIAGNOSIS — Z23 Encounter for immunization: Secondary | ICD-10-CM | POA: Diagnosis not present

## 2021-11-11 DIAGNOSIS — E7800 Pure hypercholesterolemia, unspecified: Secondary | ICD-10-CM | POA: Diagnosis not present

## 2021-11-11 DIAGNOSIS — Z0000 Encounter for general adult medical examination without abnormal findings: Secondary | ICD-10-CM | POA: Diagnosis not present

## 2021-11-11 DIAGNOSIS — M8580 Other specified disorders of bone density and structure, unspecified site: Secondary | ICD-10-CM | POA: Diagnosis not present

## 2021-11-11 DIAGNOSIS — Z1331 Encounter for screening for depression: Secondary | ICD-10-CM | POA: Diagnosis not present

## 2021-11-11 DIAGNOSIS — Z6833 Body mass index (BMI) 33.0-33.9, adult: Secondary | ICD-10-CM | POA: Diagnosis not present

## 2021-11-11 DIAGNOSIS — I10 Essential (primary) hypertension: Secondary | ICD-10-CM | POA: Diagnosis not present

## 2021-12-16 DIAGNOSIS — M79642 Pain in left hand: Secondary | ICD-10-CM | POA: Diagnosis not present

## 2021-12-16 DIAGNOSIS — M1812 Unilateral primary osteoarthritis of first carpometacarpal joint, left hand: Secondary | ICD-10-CM | POA: Diagnosis not present

## 2021-12-16 DIAGNOSIS — M67442 Ganglion, left hand: Secondary | ICD-10-CM | POA: Diagnosis not present

## 2022-01-03 DIAGNOSIS — I10 Essential (primary) hypertension: Secondary | ICD-10-CM | POA: Diagnosis not present

## 2022-02-17 DIAGNOSIS — M67442 Ganglion, left hand: Secondary | ICD-10-CM | POA: Diagnosis not present

## 2022-02-17 DIAGNOSIS — M79642 Pain in left hand: Secondary | ICD-10-CM | POA: Diagnosis not present

## 2022-02-17 DIAGNOSIS — M1812 Unilateral primary osteoarthritis of first carpometacarpal joint, left hand: Secondary | ICD-10-CM | POA: Diagnosis not present

## 2022-02-17 DIAGNOSIS — R52 Pain, unspecified: Secondary | ICD-10-CM | POA: Diagnosis not present

## 2022-03-21 DIAGNOSIS — Z6834 Body mass index (BMI) 34.0-34.9, adult: Secondary | ICD-10-CM | POA: Diagnosis not present

## 2022-03-21 DIAGNOSIS — M1990 Unspecified osteoarthritis, unspecified site: Secondary | ICD-10-CM | POA: Diagnosis not present

## 2022-03-24 DIAGNOSIS — Z961 Presence of intraocular lens: Secondary | ICD-10-CM | POA: Diagnosis not present

## 2022-03-29 IMAGING — MG DIGITAL SCREENING BILAT W/ TOMO W/ CAD
8 series · 8 of 24 positions shown · non-contrast
Comparison: Previous exam(s).

CLINICAL DATA: Screening.

EXAM:
DIGITAL SCREENING BILATERAL MAMMOGRAM WITH TOMO AND CAD

[R MLO synth-2D]
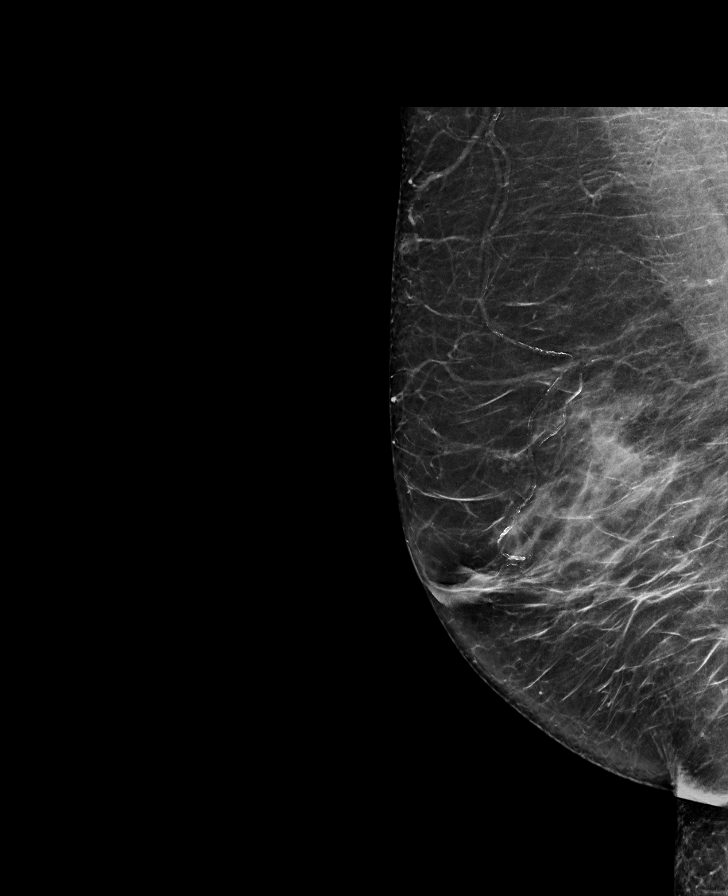

[L CC synth-2D]
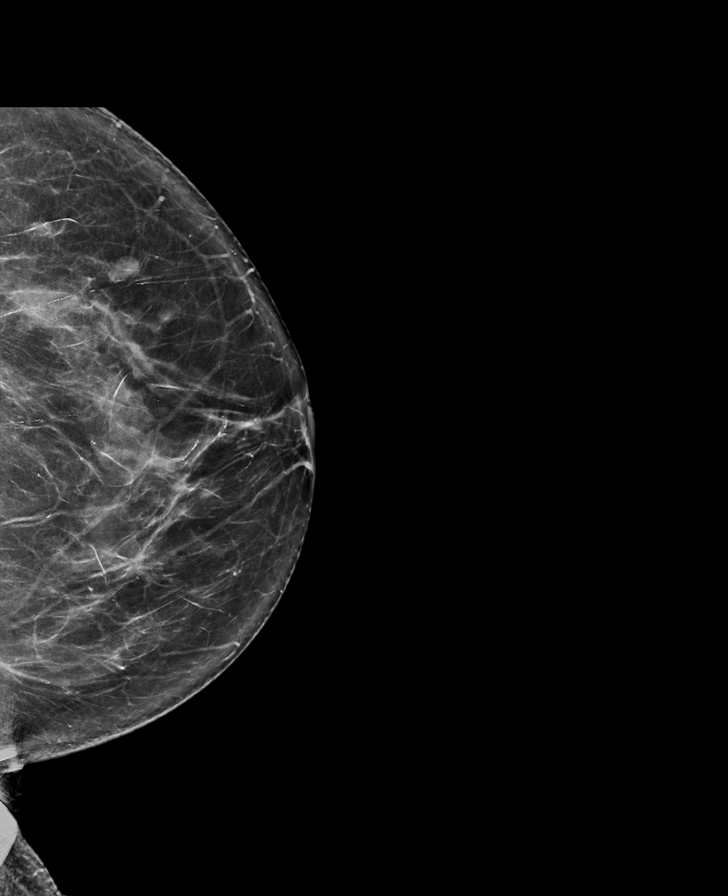

[R CC synth-2D]
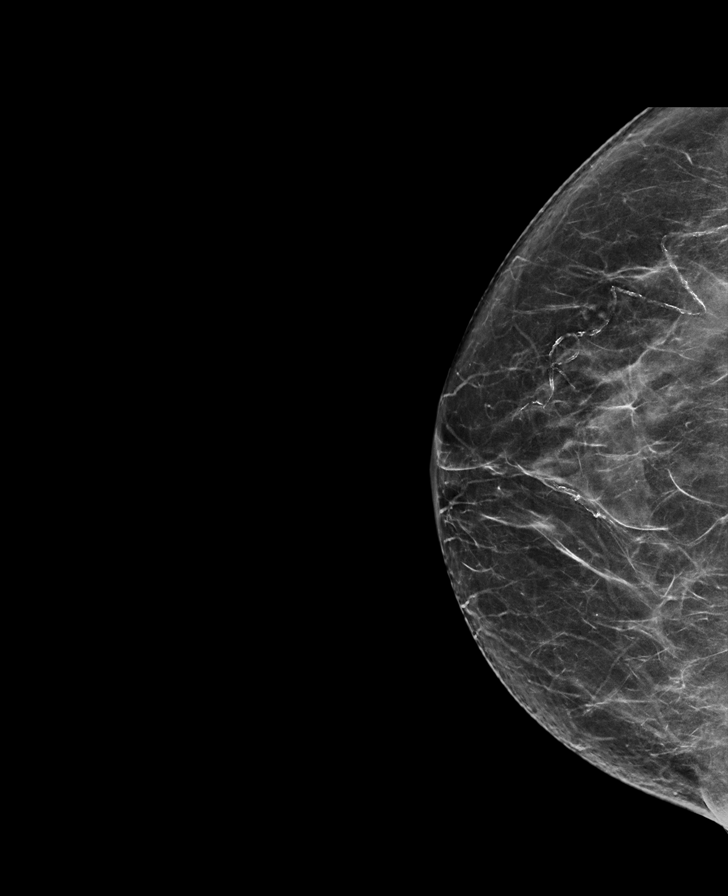

[L MLO synth-2D]
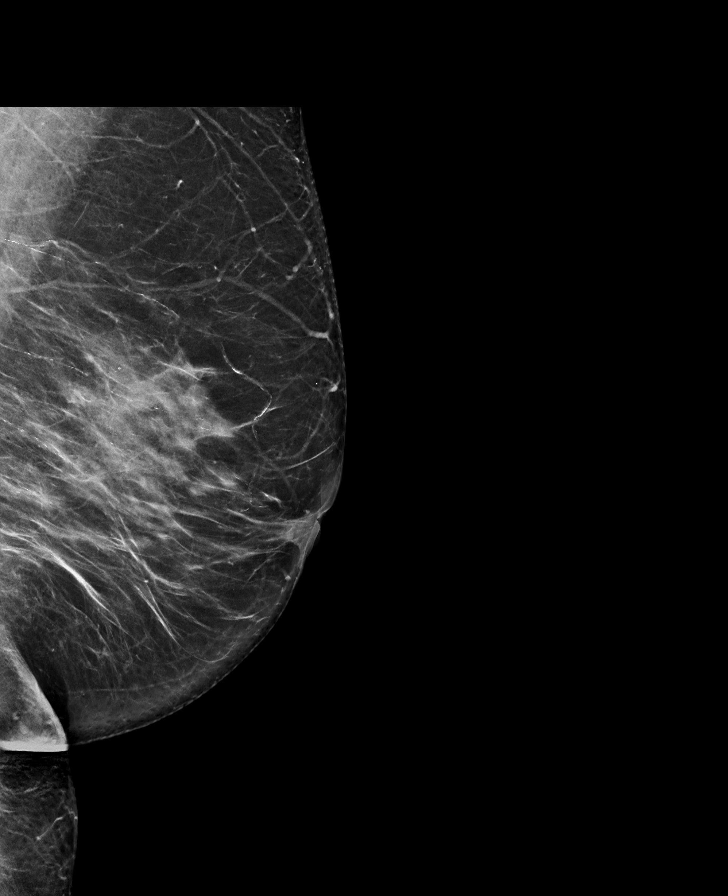

[L CC tomo · tomo slice 40/79.0]
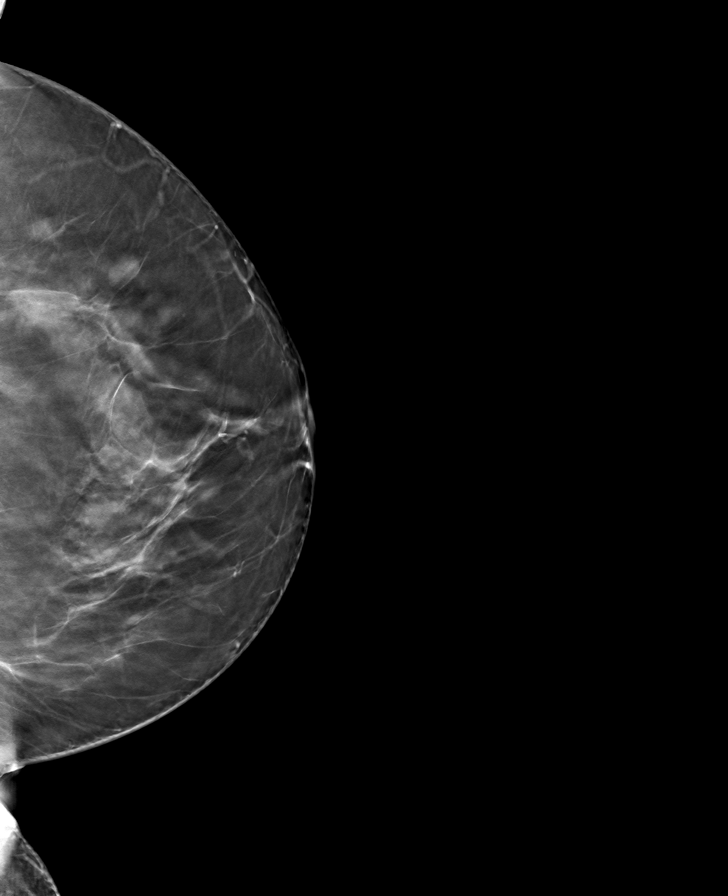

[R MLO tomo · tomo slice 39/78.0]
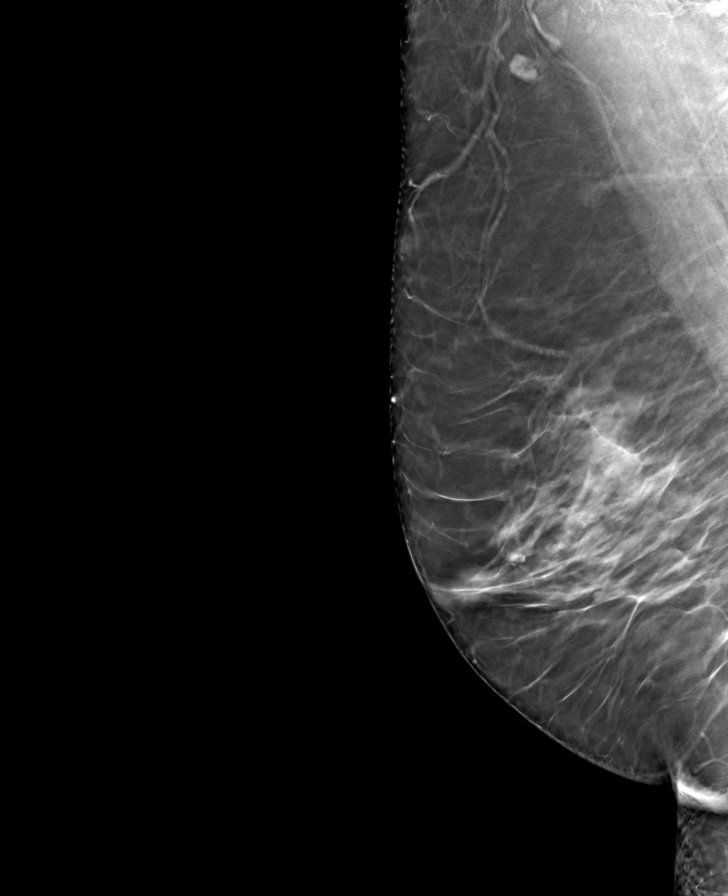

[L MLO tomo · tomo slice 42/83.0]
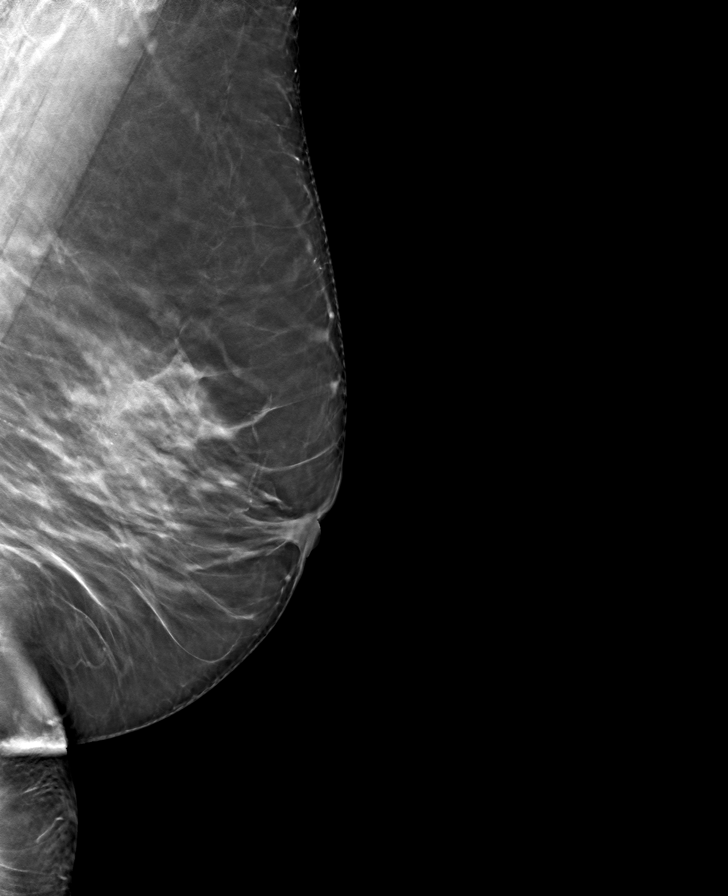

[R CC tomo · tomo slice 39/76.0]
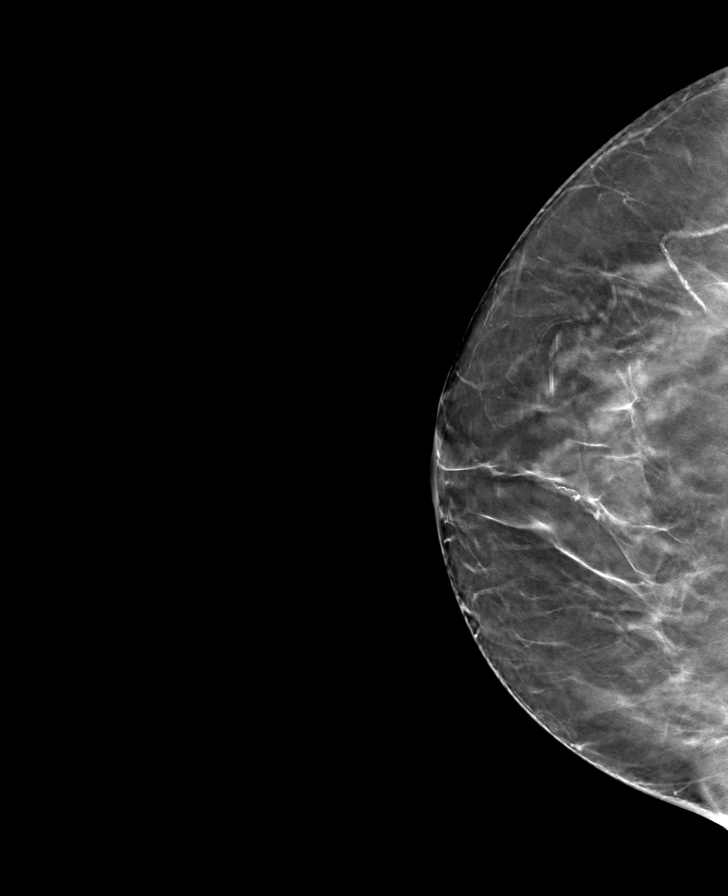

[8 of 24 positions shown; findings below may reference images not displayed]

ACR Breast Density Category c: The breast tissue is heterogeneously
dense, which may obscure small masses.
FINDINGS: In the left breast, a possible mass warrants further evaluation. In
the right breast, no findings suspicious for malignancy.

Images were processed with CAD.
IMPRESSION: Further evaluation is suggested for possible mass in the left
breast.

RECOMMENDATION:
Diagnostic mammogram and possibly ultrasound of the left breast.
(Code:[id number])

The patient will be contacted regarding the findings, and additional
imaging will be scheduled.

BI-RADS CATEGORY  0: Incomplete. Need additional imaging evaluation
and/or prior mammograms for comparison.

## 2022-05-26 DIAGNOSIS — M1711 Unilateral primary osteoarthritis, right knee: Secondary | ICD-10-CM | POA: Diagnosis not present

## 2022-06-21 DIAGNOSIS — J329 Chronic sinusitis, unspecified: Secondary | ICD-10-CM | POA: Diagnosis not present

## 2022-06-21 DIAGNOSIS — H7292 Unspecified perforation of tympanic membrane, left ear: Secondary | ICD-10-CM | POA: Diagnosis not present

## 2022-06-23 DIAGNOSIS — M1711 Unilateral primary osteoarthritis, right knee: Secondary | ICD-10-CM | POA: Diagnosis not present

## 2022-07-26 DIAGNOSIS — E785 Hyperlipidemia, unspecified: Secondary | ICD-10-CM | POA: Diagnosis not present

## 2022-07-26 DIAGNOSIS — M810 Age-related osteoporosis without current pathological fracture: Secondary | ICD-10-CM | POA: Diagnosis not present

## 2022-07-26 DIAGNOSIS — E559 Vitamin D deficiency, unspecified: Secondary | ICD-10-CM | POA: Diagnosis not present

## 2022-07-26 DIAGNOSIS — I739 Peripheral vascular disease, unspecified: Secondary | ICD-10-CM | POA: Diagnosis not present

## 2022-07-26 DIAGNOSIS — M5430 Sciatica, unspecified side: Secondary | ICD-10-CM | POA: Diagnosis not present

## 2022-07-26 DIAGNOSIS — H04129 Dry eye syndrome of unspecified lacrimal gland: Secondary | ICD-10-CM | POA: Diagnosis not present

## 2022-07-26 DIAGNOSIS — I10 Essential (primary) hypertension: Secondary | ICD-10-CM | POA: Diagnosis not present

## 2022-07-26 DIAGNOSIS — M1990 Unspecified osteoarthritis, unspecified site: Secondary | ICD-10-CM | POA: Diagnosis not present

## 2022-07-26 DIAGNOSIS — E669 Obesity, unspecified: Secondary | ICD-10-CM | POA: Diagnosis not present

## 2022-08-01 ENCOUNTER — Other Ambulatory Visit: Payer: Self-pay | Admitting: Family Medicine

## 2022-08-01 DIAGNOSIS — Z1231 Encounter for screening mammogram for malignant neoplasm of breast: Secondary | ICD-10-CM

## 2022-08-12 DIAGNOSIS — M19011 Primary osteoarthritis, right shoulder: Secondary | ICD-10-CM | POA: Diagnosis not present

## 2022-09-01 DIAGNOSIS — L600 Ingrowing nail: Secondary | ICD-10-CM | POA: Diagnosis not present

## 2022-10-05 ENCOUNTER — Ambulatory Visit
Admission: RE | Admit: 2022-10-05 | Discharge: 2022-10-05 | Disposition: A | Discharge status: Home / Self Care | Payer: Medicare PPO | Source: Ambulatory Visit | Attending: Family Medicine | Admitting: Family Medicine

## 2022-10-05 DIAGNOSIS — Z1231 Encounter for screening mammogram for malignant neoplasm of breast: Secondary | ICD-10-CM

## 2022-10-14 DIAGNOSIS — H6983 Other specified disorders of Eustachian tube, bilateral: Secondary | ICD-10-CM | POA: Diagnosis not present

## 2022-10-14 DIAGNOSIS — Z4589 Encounter for adjustment and management of other implanted devices: Secondary | ICD-10-CM | POA: Diagnosis not present

## 2022-10-14 DIAGNOSIS — H6993 Unspecified Eustachian tube disorder, bilateral: Secondary | ICD-10-CM | POA: Diagnosis not present

## 2022-10-14 DIAGNOSIS — Z9889 Other specified postprocedural states: Secondary | ICD-10-CM | POA: Diagnosis not present

## 2022-10-14 DIAGNOSIS — H9202 Otalgia, left ear: Secondary | ICD-10-CM | POA: Diagnosis not present

## 2022-10-14 DIAGNOSIS — Z974 Presence of external hearing-aid: Secondary | ICD-10-CM | POA: Diagnosis not present

## 2022-10-14 DIAGNOSIS — H7202 Central perforation of tympanic membrane, left ear: Secondary | ICD-10-CM | POA: Diagnosis not present

## 2022-10-14 DIAGNOSIS — H906 Mixed conductive and sensorineural hearing loss, bilateral: Secondary | ICD-10-CM | POA: Diagnosis not present

## 2022-10-31 DIAGNOSIS — M1711 Unilateral primary osteoarthritis, right knee: Secondary | ICD-10-CM | POA: Diagnosis not present

## 2022-11-22 DIAGNOSIS — E7800 Pure hypercholesterolemia, unspecified: Secondary | ICD-10-CM | POA: Diagnosis not present

## 2022-11-23 DIAGNOSIS — Z1339 Encounter for screening examination for other mental health and behavioral disorders: Secondary | ICD-10-CM | POA: Diagnosis not present

## 2022-11-23 DIAGNOSIS — Z0000 Encounter for general adult medical examination without abnormal findings: Secondary | ICD-10-CM | POA: Diagnosis not present

## 2022-11-23 DIAGNOSIS — I10 Essential (primary) hypertension: Secondary | ICD-10-CM | POA: Diagnosis not present

## 2022-11-23 DIAGNOSIS — E7800 Pure hypercholesterolemia, unspecified: Secondary | ICD-10-CM | POA: Diagnosis not present

## 2022-11-23 DIAGNOSIS — M8580 Other specified disorders of bone density and structure, unspecified site: Secondary | ICD-10-CM | POA: Diagnosis not present

## 2022-11-23 DIAGNOSIS — Z6833 Body mass index (BMI) 33.0-33.9, adult: Secondary | ICD-10-CM | POA: Diagnosis not present

## 2022-11-23 DIAGNOSIS — Z1331 Encounter for screening for depression: Secondary | ICD-10-CM | POA: Diagnosis not present

## 2022-11-23 DIAGNOSIS — M8589 Other specified disorders of bone density and structure, multiple sites: Secondary | ICD-10-CM | POA: Diagnosis not present

## 2022-11-30 DIAGNOSIS — D485 Neoplasm of uncertain behavior of skin: Secondary | ICD-10-CM | POA: Diagnosis not present

## 2022-12-07 DIAGNOSIS — C44311 Basal cell carcinoma of skin of nose: Secondary | ICD-10-CM | POA: Diagnosis not present

## 2023-03-02 DIAGNOSIS — H18513 Endothelial corneal dystrophy, bilateral: Secondary | ICD-10-CM | POA: Diagnosis not present

## 2023-03-27 DIAGNOSIS — L578 Other skin changes due to chronic exposure to nonionizing radiation: Secondary | ICD-10-CM | POA: Diagnosis not present

## 2023-03-27 DIAGNOSIS — C44311 Basal cell carcinoma of skin of nose: Secondary | ICD-10-CM | POA: Diagnosis not present

## 2023-03-28 DIAGNOSIS — H18513 Endothelial corneal dystrophy, bilateral: Secondary | ICD-10-CM | POA: Diagnosis not present

## 2023-04-24 DIAGNOSIS — M1711 Unilateral primary osteoarthritis, right knee: Secondary | ICD-10-CM | POA: Diagnosis not present

## 2023-05-22 DIAGNOSIS — M1712 Unilateral primary osteoarthritis, left knee: Secondary | ICD-10-CM | POA: Diagnosis not present

## 2023-05-29 DIAGNOSIS — J329 Chronic sinusitis, unspecified: Secondary | ICD-10-CM | POA: Diagnosis not present

## 2023-06-09 DIAGNOSIS — J329 Chronic sinusitis, unspecified: Secondary | ICD-10-CM | POA: Diagnosis not present

## 2023-06-09 DIAGNOSIS — Z6832 Body mass index (BMI) 32.0-32.9, adult: Secondary | ICD-10-CM | POA: Diagnosis not present

## 2023-06-20 DIAGNOSIS — M1712 Unilateral primary osteoarthritis, left knee: Secondary | ICD-10-CM | POA: Diagnosis not present

## 2023-06-23 DIAGNOSIS — Z6832 Body mass index (BMI) 32.0-32.9, adult: Secondary | ICD-10-CM | POA: Diagnosis not present

## 2023-06-23 DIAGNOSIS — H9201 Otalgia, right ear: Secondary | ICD-10-CM | POA: Diagnosis not present

## 2023-07-06 DIAGNOSIS — H7202 Central perforation of tympanic membrane, left ear: Secondary | ICD-10-CM | POA: Diagnosis not present

## 2023-07-06 DIAGNOSIS — H906 Mixed conductive and sensorineural hearing loss, bilateral: Secondary | ICD-10-CM | POA: Diagnosis not present

## 2023-07-06 DIAGNOSIS — Z4589 Encounter for adjustment and management of other implanted devices: Secondary | ICD-10-CM | POA: Diagnosis not present

## 2023-07-06 DIAGNOSIS — H938X1 Other specified disorders of right ear: Secondary | ICD-10-CM | POA: Diagnosis not present

## 2023-07-06 DIAGNOSIS — H6993 Unspecified Eustachian tube disorder, bilateral: Secondary | ICD-10-CM | POA: Diagnosis not present

## 2023-07-19 DIAGNOSIS — H18513 Endothelial corneal dystrophy, bilateral: Secondary | ICD-10-CM | POA: Diagnosis not present

## 2023-07-19 DIAGNOSIS — H02882 Meibomian gland dysfunction right lower eyelid: Secondary | ICD-10-CM | POA: Diagnosis not present

## 2023-07-19 DIAGNOSIS — H02885 Meibomian gland dysfunction left lower eyelid: Secondary | ICD-10-CM | POA: Diagnosis not present

## 2023-07-20 DIAGNOSIS — Z9889 Other specified postprocedural states: Secondary | ICD-10-CM | POA: Diagnosis not present

## 2023-07-20 DIAGNOSIS — H7202 Central perforation of tympanic membrane, left ear: Secondary | ICD-10-CM | POA: Diagnosis not present

## 2023-07-20 DIAGNOSIS — H6993 Unspecified Eustachian tube disorder, bilateral: Secondary | ICD-10-CM | POA: Diagnosis not present

## 2023-07-20 DIAGNOSIS — H906 Mixed conductive and sensorineural hearing loss, bilateral: Secondary | ICD-10-CM | POA: Diagnosis not present

## 2023-07-25 DIAGNOSIS — D2239 Melanocytic nevi of other parts of face: Secondary | ICD-10-CM | POA: Diagnosis not present

## 2023-07-25 DIAGNOSIS — D225 Melanocytic nevi of trunk: Secondary | ICD-10-CM | POA: Diagnosis not present

## 2023-07-25 DIAGNOSIS — L821 Other seborrheic keratosis: Secondary | ICD-10-CM | POA: Diagnosis not present

## 2023-07-25 DIAGNOSIS — L820 Inflamed seborrheic keratosis: Secondary | ICD-10-CM | POA: Diagnosis not present

## 2023-07-25 DIAGNOSIS — L814 Other melanin hyperpigmentation: Secondary | ICD-10-CM | POA: Diagnosis not present

## 2023-08-22 ENCOUNTER — Other Ambulatory Visit: Payer: Self-pay | Admitting: Family Medicine

## 2023-08-22 DIAGNOSIS — Z1231 Encounter for screening mammogram for malignant neoplasm of breast: Secondary | ICD-10-CM

## 2023-09-15 ENCOUNTER — Ambulatory Visit
Admission: RE | Admit: 2023-09-15 | Discharge: 2023-09-15 | Disposition: A | Discharge status: Home / Self Care | Source: Ambulatory Visit | Attending: Family Medicine | Admitting: Family Medicine

## 2023-09-15 DIAGNOSIS — Z1231 Encounter for screening mammogram for malignant neoplasm of breast: Secondary | ICD-10-CM

## 2023-10-10 ENCOUNTER — Ambulatory Visit
Admission: RE | Admit: 2023-10-10 | Discharge: 2023-10-10 | Disposition: A | Discharge status: Home / Self Care | Source: Ambulatory Visit | Attending: Family Medicine | Admitting: Family Medicine

## 2023-10-10 DIAGNOSIS — Z1231 Encounter for screening mammogram for malignant neoplasm of breast: Secondary | ICD-10-CM | POA: Diagnosis not present

## 2023-11-14 DIAGNOSIS — L600 Ingrowing nail: Secondary | ICD-10-CM | POA: Diagnosis not present

## 2023-11-14 DIAGNOSIS — M79674 Pain in right toe(s): Secondary | ICD-10-CM | POA: Diagnosis not present

## 2023-12-01 DIAGNOSIS — Z0000 Encounter for general adult medical examination without abnormal findings: Secondary | ICD-10-CM | POA: Diagnosis not present

## 2023-12-01 DIAGNOSIS — E7800 Pure hypercholesterolemia, unspecified: Secondary | ICD-10-CM | POA: Diagnosis not present

## 2023-12-01 DIAGNOSIS — I10 Essential (primary) hypertension: Secondary | ICD-10-CM | POA: Diagnosis not present

## 2023-12-06 DIAGNOSIS — M8580 Other specified disorders of bone density and structure, unspecified site: Secondary | ICD-10-CM | POA: Diagnosis not present

## 2023-12-06 DIAGNOSIS — M1990 Unspecified osteoarthritis, unspecified site: Secondary | ICD-10-CM | POA: Diagnosis not present

## 2023-12-06 DIAGNOSIS — I10 Essential (primary) hypertension: Secondary | ICD-10-CM | POA: Diagnosis not present

## 2023-12-06 DIAGNOSIS — E785 Hyperlipidemia, unspecified: Secondary | ICD-10-CM | POA: Diagnosis not present

## 2023-12-06 DIAGNOSIS — Z7982 Long term (current) use of aspirin: Secondary | ICD-10-CM | POA: Diagnosis not present

## 2023-12-06 DIAGNOSIS — I739 Peripheral vascular disease, unspecified: Secondary | ICD-10-CM | POA: Diagnosis not present

## 2023-12-06 DIAGNOSIS — M5430 Sciatica, unspecified side: Secondary | ICD-10-CM | POA: Diagnosis not present

## 2023-12-06 DIAGNOSIS — M3500 Sicca syndrome, unspecified: Secondary | ICD-10-CM | POA: Diagnosis not present

## 2023-12-06 DIAGNOSIS — E669 Obesity, unspecified: Secondary | ICD-10-CM | POA: Diagnosis not present

## 2023-12-07 DIAGNOSIS — Z961 Presence of intraocular lens: Secondary | ICD-10-CM | POA: Diagnosis not present

## 2023-12-14 DIAGNOSIS — Z1339 Encounter for screening examination for other mental health and behavioral disorders: Secondary | ICD-10-CM | POA: Diagnosis not present

## 2023-12-14 DIAGNOSIS — I10 Essential (primary) hypertension: Secondary | ICD-10-CM | POA: Diagnosis not present

## 2023-12-14 DIAGNOSIS — Z6832 Body mass index (BMI) 32.0-32.9, adult: Secondary | ICD-10-CM | POA: Diagnosis not present

## 2023-12-14 DIAGNOSIS — M8580 Other specified disorders of bone density and structure, unspecified site: Secondary | ICD-10-CM | POA: Diagnosis not present

## 2023-12-14 DIAGNOSIS — Z1331 Encounter for screening for depression: Secondary | ICD-10-CM | POA: Diagnosis not present

## 2023-12-14 DIAGNOSIS — E7800 Pure hypercholesterolemia, unspecified: Secondary | ICD-10-CM | POA: Diagnosis not present

## 2023-12-14 DIAGNOSIS — Z0000 Encounter for general adult medical examination without abnormal findings: Secondary | ICD-10-CM | POA: Diagnosis not present

## 2023-12-14 DIAGNOSIS — M1990 Unspecified osteoarthritis, unspecified site: Secondary | ICD-10-CM | POA: Diagnosis not present
